# Patient Record
Sex: Female | Born: 1955
Health system: Southern US, Community
[De-identification: ages and names within clinical notes are randomized; demographics above are authoritative.]

## PROBLEM LIST (undated history)

## (undated) DIAGNOSIS — E119 Type 2 diabetes mellitus without complications: Secondary | ICD-10-CM

## (undated) DIAGNOSIS — I10 Essential (primary) hypertension: Secondary | ICD-10-CM

## (undated) DIAGNOSIS — G4762 Sleep related leg cramps: Secondary | ICD-10-CM

## (undated) DIAGNOSIS — G709 Myoneural disorder, unspecified: Secondary | ICD-10-CM

## (undated) DIAGNOSIS — K219 Gastro-esophageal reflux disease without esophagitis: Secondary | ICD-10-CM

## (undated) DIAGNOSIS — M1990 Unspecified osteoarthritis, unspecified site: Secondary | ICD-10-CM

## (undated) HISTORY — PX: FOOT SURGERY: SHX648

## (undated) HISTORY — PX: BACK SURGERY: SHX140

## (undated) HISTORY — PX: TONSILLECTOMY: SUR1361

## (undated) HISTORY — PX: ROTATOR CUFF REPAIR: SHX139

## (undated) HISTORY — PX: TUBAL LIGATION: SHX77

---

## 1978-03-10 HISTORY — PX: TUBAL LIGATION: SHX77

## 1998-09-06 ENCOUNTER — Other Ambulatory Visit: Admission: RE | Admit: 1998-09-06 | Discharge: 1998-09-06 | Payer: Self-pay | Admitting: Obstetrics and Gynecology

## 1999-05-31 ENCOUNTER — Encounter: Payer: Self-pay | Admitting: Family Medicine

## 1999-05-31 ENCOUNTER — Encounter: Admission: RE | Admit: 1999-05-31 | Discharge: 1999-05-31 | Payer: Self-pay | Admitting: Family Medicine

## 1999-08-12 ENCOUNTER — Other Ambulatory Visit: Admission: RE | Admit: 1999-08-12 | Discharge: 1999-08-12 | Payer: Self-pay | Admitting: Family Medicine

## 2000-06-04 ENCOUNTER — Encounter: Payer: Self-pay | Admitting: Family Medicine

## 2000-06-04 ENCOUNTER — Encounter: Admission: RE | Admit: 2000-06-04 | Discharge: 2000-06-04 | Payer: Self-pay | Admitting: Family Medicine

## 2000-07-08 ENCOUNTER — Other Ambulatory Visit: Admission: RE | Admit: 2000-07-08 | Discharge: 2000-07-08 | Payer: Self-pay | Admitting: Family Medicine

## 2001-10-26 ENCOUNTER — Encounter: Admission: RE | Admit: 2001-10-26 | Discharge: 2001-10-26 | Payer: Self-pay | Admitting: Family Medicine

## 2001-10-26 ENCOUNTER — Encounter: Payer: Self-pay | Admitting: Family Medicine

## 2003-04-06 ENCOUNTER — Encounter: Admission: RE | Admit: 2003-04-06 | Discharge: 2003-04-06 | Payer: Self-pay | Admitting: Family Medicine

## 2003-11-08 ENCOUNTER — Other Ambulatory Visit: Admission: RE | Admit: 2003-11-08 | Discharge: 2003-11-08 | Payer: Self-pay | Admitting: Family Medicine

## 2004-01-11 ENCOUNTER — Ambulatory Visit (HOSPITAL_COMMUNITY): Admission: RE | Admit: 2004-01-11 | Discharge: 2004-01-11 | Payer: Self-pay | Admitting: Gastroenterology

## 2004-02-14 ENCOUNTER — Other Ambulatory Visit: Admission: RE | Admit: 2004-02-14 | Discharge: 2004-02-14 | Payer: Self-pay | Admitting: Family Medicine

## 2004-11-05 ENCOUNTER — Encounter: Admission: RE | Admit: 2004-11-05 | Discharge: 2004-11-05 | Payer: Self-pay | Admitting: Family Medicine

## 2004-12-31 ENCOUNTER — Other Ambulatory Visit: Admission: RE | Admit: 2004-12-31 | Discharge: 2004-12-31 | Payer: Self-pay | Admitting: Family Medicine

## 2005-12-23 ENCOUNTER — Encounter: Admission: RE | Admit: 2005-12-23 | Discharge: 2005-12-23 | Payer: Self-pay | Admitting: Family Medicine

## 2006-01-05 ENCOUNTER — Other Ambulatory Visit: Admission: RE | Admit: 2006-01-05 | Discharge: 2006-01-05 | Payer: Self-pay | Admitting: Family Medicine

## 2006-03-26 ENCOUNTER — Ambulatory Visit (HOSPITAL_COMMUNITY): Admission: RE | Admit: 2006-03-26 | Discharge: 2006-03-27 | Payer: Self-pay | Admitting: Specialist

## 2007-01-11 ENCOUNTER — Encounter: Admission: RE | Admit: 2007-01-11 | Discharge: 2007-01-11 | Payer: Self-pay | Admitting: Family Medicine

## 2007-02-09 ENCOUNTER — Other Ambulatory Visit: Admission: RE | Admit: 2007-02-09 | Discharge: 2007-02-09 | Payer: Self-pay | Admitting: Family Medicine

## 2008-02-21 ENCOUNTER — Other Ambulatory Visit: Admission: RE | Admit: 2008-02-21 | Discharge: 2008-02-21 | Payer: Self-pay | Admitting: Family Medicine

## 2009-01-30 ENCOUNTER — Encounter: Admission: RE | Admit: 2009-01-30 | Discharge: 2009-01-30 | Payer: Self-pay | Admitting: Family Medicine

## 2009-02-08 ENCOUNTER — Encounter: Admission: RE | Admit: 2009-02-08 | Discharge: 2009-02-08 | Payer: Self-pay | Admitting: Family Medicine

## 2010-03-07 ENCOUNTER — Encounter
Admission: RE | Admit: 2010-03-07 | Discharge: 2010-03-07 | Payer: Self-pay | Source: Home / Self Care | Attending: Family Medicine | Admitting: Family Medicine

## 2010-03-15 ENCOUNTER — Encounter
Admission: RE | Admit: 2010-03-15 | Discharge: 2010-03-15 | Payer: Self-pay | Source: Home / Self Care | Attending: Family Medicine | Admitting: Family Medicine

## 2010-03-31 ENCOUNTER — Encounter: Payer: Self-pay | Admitting: Family Medicine

## 2010-07-26 NOTE — Op Note (Signed)
NAME:  [name], [name]             ACCOUNT NO.:  [id number]   MEDICAL RECORD NO.:  [id number]          PATIENT TYPE:  AMB   LOCATION:  ENDO                         FACILITY:  [facility]   PHYSICIAN:  Graylin Shiver, M.D.   DATE OF BIRTH:  07/06/1955   DATE OF PROCEDURE:  01/11/2004  DATE OF DISCHARGE:                                 OPERATIVE REPORT   PROCEDURE:  Colonoscopy.   INDICATION FOR PROCEDURE:  Family history of colon cancer.   Informed consent was obtained after explanation of the risks of bleeding,  infection, and perforation.   PREMEDICATIONS:  1.  Fentanyl 100 mcg IV.  2.  Versed 9 mg IV.   DESCRIPTION OF PROCEDURE:  With the patient in the left lateral decubitus  position, a rectal exam was performed; no masses were felt.  The Olympus  colonoscope was inserted into the rectum and advanced around a tortuous  colon to the cecum.  Cecal landmarks were identified.  The cecum and  ascending colon were normal.  The transverse colon normal.  The descending  colon normal.  The sigmoid showed a few diverticula, and the rectum was  normal.  She tolerated the procedure well without complications.   IMPRESSION:  Few scattered diverticula in the sigmoid colon.   RECOMMENDATIONS:  I would recommend a follow up colonoscopy again in 5 years  in view of the family history.      SFG/MEDQ  D:  01/11/2004  T:  01/11/2004  Job:  [id number]   cc:   Molly Maduro L. Foy [place], M.D.  [address] [place]  [place] [address]  Fax: [phone]

## 2010-07-26 NOTE — Op Note (Signed)
[name], [name]             ACCOUNT NO.:  [id number]   MEDICAL RECORD NO.:  [id number]          PATIENT TYPE:  AMB   LOCATION:  DAY                          [facility]ACILITY:  [facility]   PHYSICIAN:  Jene Every, M.D.    DATE OF BIRTH:  [birth year]   DATE OF PROCEDURE:  03/26/2006  DATE OF DISCHARGE:                               OPERATIVE REPORT   PREOPERATIVE DIAGNOSIS:  Herniated nucleus pulposus, spinal stenosis, L5-  S1, left.   POSTOPERATIVE DIAGNOSIS:  Herniated nucleus pulposus, spinal stenosis,  L5-S1, left.   PROCEDURE PERFORMED:  Lateral recess decompression, microdiskectomy,  foraminotomy of L5-S1, left.   ANESTHESIA:  General.   ASSISTANT:  Roma Schanz, P.A.   BRIEF HISTORY/INDICATIONS:  A 55 year old with S1 radiculopathy with  positive neural tension signs.  Plantar flexion weakness, diminished  Achilles reflexes.  MRI indicating focal disk herniation compressing the  S1 root and the lateral recess, associated stenosis.  Operative  intervention was indicated for decompression of the S1 nerve root.  Risks and benefits were discussed, including bleeding, infection, damage  to vascular structures, CSF leakage, epidural fibrosis, adjacent segment  disease, need for fusion in the future, anesthetic complications, etc.   TECHNIQUE:  With the patient in supine position after the induction of  adequate general anesthesia and 1 gm of Kefzol, she was placed on the  [place] frame.  All bony prominences were well padded.  The lumbar  region was prepped and draped in the usual sterile fashion.  Two 18  gauge spinal needles were utilized to localize the 51 interspace,  confirmed with x-ray.  An incision was made through the spinous process  of 5-S1.  Subcutaneous tissue was dissected.  Electrocautery was  utilized to achieve hemostasis.  The dorsal lumbar fascia identified  bilaterally in the skin incision.  The paraspinous muscle was elevated  from the lamina of 5 and 1.   McCullough retractor was placed.  The  operating was draped and brought into the surgical field after a  Penfield 4 was placed in the interlaminar space, confirming the disk  space at L5-S1.  Hemilaminotomy of the caudad edge of 5 was performed  with 3 mm Kerrison.  The cephalad edges of S1 was detached, utilizing a  straight curette.  Foraminotomy of S1 was performed.  Severe compression  of the S1 nerve root was noted.  After that, a foraminotomy was  performed to decompress the lateral recess, utilizing a 2 mm Kerrison.  This was done through the medial border of the pedicle.  I then gently  mobilized an S1 nerve root medially.  We found a focal HNP; therefore,  annulotomy was performed.  Copious portion of disk material was removed  with straight and up-biting pituitary and mobilized with an Epstein and  further fragments were removed.  Following the thorough diskectomy and  decompression, the median nerve root was erythematous and edematous but  had at least 1 cm of excursion medial to the pedicle without difficulty.  I passed a hockey stick probe in the foramen of 5 and S1, found to be  widely patent.  I  checked the axilla nerve root, the shoulder of the  root, and beneath the thecal sac.  There was no evidence of residual  disk herniation.  Next, the disk space was copiously irrigated with  antibiotic irrigation.  Inspection revealed no evidence of CSF leakage  or active bleeding.  Thrombin-soaked Gelfoam was placed in the  laminotomy defect.  The McCullough retractor was removed, and the  paraspinous muscle was inspected.  Once there was no active bleeding,  the dorsal lumbar fascia was reapproximated with 0 Vicryl interrupted  figure-of-eight sutures.  Subcutaneous tissue reapproximated with 2-0  Vicryl simple sutures.  The skin was reapproximated with 4-0  subcuticular Prolene.  The wound was reinforced with  Steri-Strips.  Sterile dressing applied.  Placed supine on the  hospital  bed.  Extubated without difficulty.  Transported to the recovery room in  satisfactory condition.   Patient tolerated the procedure well without complications.      Jene Every, M.D.  Electronically Signed     JB/MEDQ  D:  03/26/2006  T:  03/26/2006  Job:  [id number]

## 2011-02-11 ENCOUNTER — Other Ambulatory Visit: Payer: Self-pay | Admitting: Family Medicine

## 2011-02-11 DIAGNOSIS — Z1231 Encounter for screening mammogram for malignant neoplasm of breast: Secondary | ICD-10-CM

## 2011-03-17 ENCOUNTER — Ambulatory Visit
Admission: RE | Admit: 2011-03-17 | Discharge: 2011-03-17 | Disposition: A | Discharge status: Home / Self Care | Payer: PRIVATE HEALTH INSURANCE | Source: Ambulatory Visit | Attending: Family Medicine | Admitting: Family Medicine

## 2011-03-17 DIAGNOSIS — Z1231 Encounter for screening mammogram for malignant neoplasm of breast: Secondary | ICD-10-CM

## 2012-03-29 ENCOUNTER — Other Ambulatory Visit: Payer: Self-pay | Admitting: Family Medicine

## 2012-03-29 DIAGNOSIS — Z1231 Encounter for screening mammogram for malignant neoplasm of breast: Secondary | ICD-10-CM

## 2012-04-22 ENCOUNTER — Ambulatory Visit: Payer: PRIVATE HEALTH INSURANCE

## 2012-04-28 ENCOUNTER — Ambulatory Visit: Payer: PRIVATE HEALTH INSURANCE

## 2012-04-30 ENCOUNTER — Ambulatory Visit
Admission: RE | Admit: 2012-04-30 | Discharge: 2012-04-30 | Disposition: A | Discharge status: Home / Self Care | Payer: BC Managed Care – PPO | Source: Ambulatory Visit | Attending: Family Medicine | Admitting: Family Medicine

## 2012-04-30 DIAGNOSIS — Z1231 Encounter for screening mammogram for malignant neoplasm of breast: Secondary | ICD-10-CM

## 2013-04-05 ENCOUNTER — Other Ambulatory Visit: Payer: Self-pay

## 2013-04-05 DIAGNOSIS — Z1231 Encounter for screening mammogram for malignant neoplasm of breast: Secondary | ICD-10-CM

## 2013-05-06 ENCOUNTER — Ambulatory Visit
Admission: RE | Admit: 2013-05-06 | Discharge: 2013-05-06 | Disposition: A | Discharge status: Home / Self Care | Payer: BC Managed Care – PPO | Source: Ambulatory Visit

## 2013-05-06 DIAGNOSIS — Z1231 Encounter for screening mammogram for malignant neoplasm of breast: Secondary | ICD-10-CM

## 2014-02-15 ENCOUNTER — Ambulatory Visit: Payer: Self-pay | Admitting: Orthopedic Surgery

## 2014-02-17 ENCOUNTER — Ambulatory Visit: Payer: Self-pay | Admitting: Orthopedic Surgery

## 2014-02-17 NOTE — H&P (Signed)
[name] DOB: 12/16/1955 Divorced / Language: English / Race: White Female  H&P Date: 02/15/14  Chief complaint: back and R leg pain  History of Present Illness  The patient is a 58 year old female who presents for a Follow-up for Follow-up back. The patient is being followed for their right-sided back pain. They are now 1 day(s) out from ESI (L-4). Symptoms reported today include: pain (low back), weakness (right leg) and leg pain (right). The patient states that they are doing poorly. Current treatment includes: NSAIDs (Duexis and Neurontin). The following medication has been used for pain control: Norco. The patient reports their current pain level to be severe and 10 / 10. The patient presents today following ESI. Note for "Follow-up back": The increased strength of Norco did help her some with pain, and Neurontin as well, however her pain has progressively worsened since her last visit. She is worst in the morning when she first gets up, better by the time she is at work and walking. She is worse sitting and lying down and better standing. Pain continues to radiate down the R buttock, lateral thigh to anterior lower leg to the ankle, no foot pain. Increased pain in the anterior, medial thigh region as well since her last visit. She has also noted some stiffness in her right knee. She is having trouble sleeping due to pain. Patient went to work Monday and had to leave due to pain. She underwent her ESI yesterday and reports zero relief. She called and spoke with Dr. Beane this morning about other options and he asked her to come in to discuss possible surgery. She is tearful and reports severe pain.  Problem List/Past Medical Hx Spinal stenosis, lumbar (M48.06) Lumbar radiculitis (M54.16) Diabetes Mellitus, Type II Diverticulitis Of Colon High blood pressure Hypercholesterolemia Syndrome, rotator cuff NOS  Bunion  Enthesopathy, hip Plantar fasciitis   Allergies  No Known  Drug Allergies  Family History First Degree Relatives Cancer mother and sister Congestive Heart Failure mother Liver Disease, Chronic sister Hypertension brother Diabetes Mellitus grandmother fathers side brother and child  Social History Children 1 Alcohol use current drinker; drinks beer; only occasionally per week Exercise Exercises rarely; does other Exercises monthly; does running / walking Drug/Alcohol Rehab (Previously) no Tobacco use never smoker Current work status working full time Drug/Alcohol Rehab (Currently) no Pain Contract no Tobacco / smoke exposure yes Marital status divorced Number of flights of stairs before winded greater than 5 Illicit drug use no Living situation live alone  Medication History  Neurontin (300MG Capsule, 1 (one) Oral po q hs x 3 days, then 1 tab po BID x 3 days, then 1 tab TID, Taken starting 02/10/2014) Active. Norco (10-325MG Tablet, 1 (one) Tablet Oral every 6-8 hours as needed for pain, Taken starting 02/07/2014) Active. Benazepril-Hydrochlorothiazide (10-12.5MG Tablet, Oral) Active. Lovastatin (40MG Tablet, Oral) Active. MetFORMIN HCl ER (500MG Tablet ER 24HR, Oral) Active. ValACYclovir HCl (500MG Tablet, Oral as needed) Active. Tylenol (325MG Tablet, 2 Oral as directed) Active. Cyclobenzaprine HCl (10MG Tablet, Oral) Active. Meloxicam (15MG Tablet, Oral) Active. Vitamin C (1000MG Tablet, 1 Oral) Active. Medications Reconciled  Pregnancy / Birth History  Pregnant no  Past Surgical History Tonsillectomy Tubal Ligation Foot Surgery left Spinal Surgery  Review of Systems General Not Present- Fever and Weight Loss. Skin Not Present- Rash. Cardiovascular Not Present- Chest Pain and Shortness of Breath. Musculoskeletal Present- Back Pain, Muscle Pain and Muscle Weakness. Not Present- Joint Swelling. Neurological Not Present- Difficulty with balance,   Loss of bladder control, Loss of bowel  control and Weakness. Endocrine Not Present- Excessive Thirst and Excessive Urination. Hematology Not Present- Easy Bruising.  Physical Exam  General Mental Status -Alert. General Appearance-pleasant, In acute distress, Tearful, appears uncomfortable(alternates between standing and sitting, leaning left). Orientation-Oriented X3. Build & Nutrition-Well nourished and Well developed. Gait-Antalgic.  Abdomen Palpation/Percussion Tenderness - Abdomen is non-tender to palpation. Rigidity (guarding) - Abdomen is soft.  Peripheral Vascular Lower Extremity Palpation - Homan's sign - Bilateral - Negative (normal). Posterior tibial pulse - Bilateral - 2+. Dorsalis pedis pulse - Bilateral - 2+.  Neurologic Motor Strength - Hip Flexion - Bilateral - 5/5. Quadriceps - Left - 5/5. Right - 5/5(trace weakness, 5-/5). Hamstrings - Left - 5/5. Right - 5/5(trace weakness, 5-/5). Ankle Dorsiflexion - Bilateral - 5/5. Ankle Plantarflexion - Bilateral - 5/5. Extensor Hallucis Longus - Left - 5/5. Right - 4/5(4+/5). Sensation Lower Extremity - Bilateral - sensation is intact to light touch in the lower extremity. Reflexes Patellar Reflex - Bilateral - 2+. Achilles Reflex - Bilateral - 2+. Babinski - Bilateral - Babinski not present. Clonus - Bilateral - clonus not present.  Musculoskeletal Spine/Ribs/Pelvis Lumbosacral Spine: Inspection and Palpation - Tenderness - right buttock is tender to palpation and right greater trochanter is tender to palpation, no tenderness to palpation of the lumbar spinous processes, no tenderness to palpation of the left flank, no tenderness to palpation of right flank, no tenderness to palpation of left lumbar paraspinals, no tenderness to palpation of right lumbar paraspinals, no tenderness to palpation of the left greater trochanter. Swelling - none. Surrounding tissue tension/texture is - soft. Sensation - normal. Other characteristics - no ecchymosis, no  abnormal warmth, no erythema, no evidence of cellulitis. ROM - Flexion - moderately decreased range of motion and painful. Extension - moderately decreased range of motion and painful. Testing limited - due to pain. Special Testing - Lumbar - Right seated straight leg raise positive(produces pain R buttock, thigh, lower leg), Left seated straight leg raise negative. Lower Extremity  Right Lower Extremity: Right Hip - ROM - Full ROM of the hip and pain-free. Right Knee - ROM - Full ROM of the knee and pain-free. Right Ankle - ROM - Full and pain-free ROM of the ankle. Left Lower Extremity: Left Hip - ROM - Full ROM of the hip and pain-free. Left Knee - ROM - Full ROM of the knee and pain-free. Left Ankle - ROM - Full and pain-free ROM of the ankle.  Lymphatic General Lymphatics Description - No Localized lymphadenopathy.  Imaging MRI images and report reviewed today by Dr. Beane with multilevel DDD. Post-op changes L5-S1 left with small left sided disc extrusion, asymptomatic. Right sided lateral recess stenosis L3-4 and L4-5 with probable encroachment on the L4 and L5 nerve roots, respectively. HNP L3-4 with right posterolateral extruded fragment. This is likely underestimated due to pt position.  Assessment & Plan Low back pain radiating to right lower extremity (M54.5) Lumbar radiculitis (M54.16) Lumbar HNP (M51.26) Spinal stenosis, lumbar (M48.06)  Pt nearly 8 years s/p decompression L5-S1 left with new onset RLE symptoms x approx 6.5 weeks without injury or change in activity, with myotomal weakness and dermatomal dysthesias, neural tension signs, refractory to NSAIDs, pain medications, neurontin, relative rest, activity modifications, HEP, progressively worsening pain. We again discussed relevant anatomy, MRI, lateral recess stenosis at L3-4 and L4-5 on the right, extruded disc fragment L3-4, in combination they are causing her RLE symptoms, multiple dermatomes as there are multiple nerve    roots affected. She does have EHL weakness consistent with L5 distribution. Discussed tx options, thus far refractory to conservative tx including recent ESI at L4-5. She likely would not benefit from further ESI's. Given her ongoing weakness, neural tension signs, and severe pain, recommend proceeding with microlumbar decompression L3-4, L4-5 right. Discussed the procedure itself as well as risks, complications, and alternatives, including but not limited to DVT, PE, infx, bleeding, failure of procedure, need for secondary procedure, nerve injury, ongoing pain/symptoms, dural tear, CSF leak, and anesthesia risk, even stroke or death. Also discussed typical post-op course as well as spine precautions: no lifting, bending, twisting, prolonged sitting x 6 weeks post-op. Discussed walking program, PT, gentle stretching post-op. All questions were answered. Patient desires to proceed with surgery. She understands the intention of this procedure is to make an unbearable situation bearable. We discussed her underlying DDD and possible ongoing lower back pain, the importance of activity modifications long term to avoid recurrent injury. Her sugars have been better controlled. We will obtain a pre-op A1C and other appropriate labwork. We will proceed accordingly. She will continue working 5-10 hr days depending on her pain level, light duty, in the interim. If unable to tolerate work she will let us know. She will follow up 10-14 days post-op for suture removal and will call with any questions or concerns. Refills by request.  I had an extensive discussion of the risks and benefits of the lumbar decompression with the patient including bleeding, infection, damage to neurovascular structures, epidural fibrosis, CSF leak requiring repair. We also discussed increase in pain, adjacent segment disease, recurrent disc herniation, need for future surgery including repeat decompression and/or fusion. We also discussed risks of  postoperative hematoma, paralysis, anesthetic complications including DVT, PE, death, cardiopulmonary dysfunction. In addition, the perioperative and postoperative courses were discussed in detail including the rehabilitative time and return to functional activity and work. I provided the patient with an illustrated handout and utilized the appropriate surgical models.  Signed electronically by Laurie Lovejoy PA-C for Dr. Beane  

## 2014-02-23 ENCOUNTER — Encounter (HOSPITAL_COMMUNITY)
Admission: RE | Admit: 2014-02-23 | Discharge: 2014-02-23 | Disposition: A | Discharge status: Home / Self Care | Payer: BC Managed Care – PPO | Source: Ambulatory Visit | Attending: Specialist | Admitting: Specialist

## 2014-02-23 ENCOUNTER — Ambulatory Visit (HOSPITAL_COMMUNITY)
Admission: RE | Admit: 2014-02-23 | Discharge: 2014-02-23 | Disposition: A | Discharge status: Home / Self Care | Payer: BC Managed Care – PPO | Source: Ambulatory Visit | Attending: Orthopedic Surgery | Admitting: Orthopedic Surgery

## 2014-02-23 ENCOUNTER — Other Ambulatory Visit: Payer: Self-pay

## 2014-02-23 ENCOUNTER — Encounter (HOSPITAL_COMMUNITY): Payer: Self-pay

## 2014-02-23 DIAGNOSIS — I10 Essential (primary) hypertension: Secondary | ICD-10-CM

## 2014-02-23 DIAGNOSIS — M5126 Other intervertebral disc displacement, lumbar region: Secondary | ICD-10-CM

## 2014-02-23 DIAGNOSIS — Z01818 Encounter for other preprocedural examination: Secondary | ICD-10-CM | POA: Insufficient documentation

## 2014-02-23 DIAGNOSIS — M479 Spondylosis, unspecified: Secondary | ICD-10-CM | POA: Insufficient documentation

## 2014-02-23 DIAGNOSIS — Z01812 Encounter for preprocedural laboratory examination: Secondary | ICD-10-CM | POA: Insufficient documentation

## 2014-02-23 DIAGNOSIS — Z01810 Encounter for preprocedural cardiovascular examination: Secondary | ICD-10-CM | POA: Diagnosis not present

## 2014-02-23 HISTORY — DX: Gastro-esophageal reflux disease without esophagitis: K21.9

## 2014-02-23 HISTORY — DX: Myoneural disorder, unspecified: G70.9

## 2014-02-23 HISTORY — DX: Unspecified osteoarthritis, unspecified site: M19.90

## 2014-02-23 HISTORY — DX: Essential (primary) hypertension: I10

## 2014-02-23 HISTORY — DX: Type 2 diabetes mellitus without complications: E11.9

## 2014-02-23 LAB — SURGICAL PCR SCREEN
MRSA, PCR: NEGATIVE
STAPHYLOCOCCUS AUREUS: NEGATIVE

## 2014-02-23 LAB — CBC
HCT: 40.5 % (ref 36.0–46.0)
Hemoglobin: 12.9 g/dL (ref 12.0–15.0)
MCH: 29.9 pg (ref 26.0–34.0)
MCHC: 31.9 g/dL (ref 30.0–36.0)
MCV: 94 fL (ref 78.0–100.0)
PLATELETS: 353 10*3/uL (ref 150–400)
RBC: 4.31 MIL/uL (ref 3.87–5.11)
RDW: 12.6 % (ref 11.5–15.5)
WBC: 9 10*3/uL (ref 4.0–10.5)

## 2014-02-23 LAB — BASIC METABOLIC PANEL
ANION GAP: 12 (ref 5–15)
BUN: 19 mg/dL (ref 6–23)
CO2: 26 mEq/L (ref 19–32)
Calcium: 10.3 mg/dL (ref 8.4–10.5)
Chloride: 101 mEq/L (ref 96–112)
Creatinine, Ser: 0.8 mg/dL (ref 0.50–1.10)
GFR, EST NON AFRICAN AMERICAN: 79 mL/min — AB (ref >90)
Glucose, Bld: 120 mg/dL — ABNORMAL HIGH (ref 70–99)
Potassium: 4.8 mEq/L (ref 3.7–5.3)
SODIUM: 139 meq/L (ref 137–147)

## 2014-02-23 LAB — HEMOGLOBIN A1C
Hgb A1c MFr Bld: 7 % — ABNORMAL HIGH (ref <5.7)
MEAN PLASMA GLUCOSE: 154 mg/dL — AB (ref <117)

## 2014-02-23 NOTE — Pre-Procedure Instructions (Signed)
02-23-14 EKG, CXR, Lumbar spine xray done today.

## 2014-02-23 NOTE — Patient Instructions (Addendum)
[place]  02/23/2014   Your procedure is scheduled on:   03-01-2014 Wednesday  Enter through [facility] Entrance and follow signs to [facility]. Arrive at     0800   AM .  Call this number if you have problems the morning of surgery: [phone]  Or Presurgical Testing [phone].   For Living Will and/or Health Care Power Attorney Forms: please provide copy for your medical record,may bring AM of surgery(Forms should be already notarized -we do not provide this service).(02-23-14  No information preferred today).      Do not eat food/ or drink: After Midnight.     Take these medicines the morning of surgery with A SIP OF WATER: Ranitidine. Gabapentin. Hydrocodone. Lovastatin.   Do not wear jewelry, make-up or nail polish.  Do not wear deodorant, lotions, powders, or perfumes.   Do not shave legs and under arms- 48 hours(2 days) prior to first CHG shower.(Shaving face and neck okay.)  Do not bring valuables to the hospital.(Hospital is not responsible for lost valuables).  Contacts, dentures or removable bridgework, body piercing, hair pins may not be worn into surgery.  Leave suitcase in the car. After surgery it may be brought to your room.  For patients admitted to the hospital, checkout time is 11:00 AM the day of discharge.(Restricted visitors-Any Persons displaying flu-like symptoms or illness).    Patients discharged the day of surgery will not be allowed to drive home. Must have responsible person with you x 24 hours once discharged.  Name and phone number of your driver: [name], [phone] cell     Please read over the following fact sheets that you were given:  CHG(Chlorhexidine Gluconate 4% Surgical Soap) use, MRSA instructions. Incentive Spirometry Instruction.           [place] - Preparing for Surgery Before surgery, you can play an important role.  Because skin is not sterile, your skin needs to be as free of  germs as possible.  You can reduce the number of germs on your skin by washing with CHG (chlorahexidine gluconate) soap before surgery.  CHG is an antiseptic cleaner which kills germs and bonds with the skin to continue killing germs even after washing. Please DO NOT use if you have an allergy to CHG or antibacterial soaps.  If your skin becomes reddened/irritated stop using the CHG and inform your nurse when you arrive at Short Stay. Do not shave (including legs and underarms) for at least 48 hours prior to the first CHG shower.  You may shave your face/neck. Please follow these instructions carefully:  1.  Shower with CHG Soap the night before surgery and the  morning of Surgery.  2.  If you choose to wash your hair, wash your hair first as usual with your  normal  shampoo.  3.  After you shampoo, rinse your hair and body thoroughly to remove the  shampoo.                           4.  Use CHG as you would any other liquid soap.  You can apply chg directly  to the skin and wash                       Gently with a scrungie or clean washcloth.  5.  Apply the CHG Soap to your body ONLY FROM THE  NECK DOWN.   Do not use on face/ open                           Wound or open sores. Avoid contact with eyes, ears mouth and genitals (private parts).                       Wash face,  Genitals (private parts) with your normal soap.             6.  Wash thoroughly, paying special attention to the area where your surgery  will be performed.  7.  Thoroughly rinse your body with warm water from the neck down.  8.  DO NOT shower/wash with your normal soap after using and rinsing off  the CHG Soap.                9.  Pat yourself dry with a clean towel.            10.  Wear clean pajamas.            11.  Place clean sheets on your bed the night of your first shower and do not  sleep with pets. Day of Surgery : Do not apply any lotions/deodorants the morning of surgery.  Please wear clean clothes to the  hospital/surgery center.  FAILURE TO FOLLOW THESE INSTRUCTIONS MAY RESULT IN THE CANCELLATION OF YOUR SURGERY PATIENT SIGNATURE_________________________________  NURSE SIGNATURE__________________________________  ________________________________________________________________________   [name]  An incentive spirometer is a tool that can help keep your lungs clear and active. This tool measures how well you are filling your lungs with each breath. Taking long deep breaths may help reverse or decrease the chance of developing breathing (pulmonary) problems (especially infection) following:  A long period of time when you are unable to move or be active. BEFORE THE PROCEDURE   If the spirometer includes an indicator to show your best effort, your nurse or respiratory therapist will set it to a desired goal.  If possible, sit up straight or lean slightly forward. Try not to slouch.  Hold the incentive spirometer in an upright position. INSTRUCTIONS FOR USE  1. Sit on the edge of your bed if possible, or sit up as far as you can in bed or on a chair. 2. Hold the incentive spirometer in an upright position. 3. Breathe out normally. 4. Place the mouthpiece in your mouth and seal your lips tightly around it. 5. Breathe in slowly and as deeply as possible, raising the piston or the ball toward the top of the column. 6. Hold your breath for 3-5 seconds or for as long as possible. Allow the piston or ball to fall to the bottom of the column. 7. Remove the mouthpiece from your mouth and breathe out normally. 8. Rest for a few seconds and repeat Steps 1 through 7 at least 10 times every 1-2 hours when you are awake. Take your time and take a few normal breaths between deep breaths. 9. The spirometer may include an indicator to show your best effort. Use the indicator as a goal to work toward during each repetition. 10. After each set of 10 deep breaths, practice coughing to be sure  your lungs are clear. If you have an incision (the cut made at the time of surgery), support your incision when coughing by placing a pillow or rolled up towels firmly against it. Once you are  able to get out of bed, walk around indoors and cough well. You may stop using the incentive spirometer when instructed by your caregiver.  RISKS AND COMPLICATIONS  Take your time so you do not get dizzy or light-headed.  If you are in pain, you may need to take or ask for pain medication before doing incentive spirometry. It is harder to take a deep breath if you are having pain. AFTER USE  Rest and breathe slowly and easily.  It can be helpful to keep track of a log of your progress. Your caregiver can provide you with a simple table to help with this. If you are using the spirometer at home, follow these instructions: [place] IF:   You are having difficultly using the spirometer.  You have trouble using the spirometer as often as instructed.  Your pain medication is not giving enough relief while using the spirometer.  You develop fever of 100.5 F (38.1 C) or higher. SEEK IMMEDIATE MEDICAL CARE IF:   You cough up bloody sputum that had not been present before.  You develop fever of 102 F (38.9 C) or greater.  You develop worsening pain at or near the incision site. MAKE SURE YOU:   Understand these instructions.  Will watch your condition.  Will get help right away if you are not doing well or get worse. Document Released: 07/07/2006 Document Revised: 05/19/2011 Document Reviewed: 09/07/2006 [facility] Patient Information 2014 [place], [place].   ________________________________________________________________________

## 2014-02-24 ENCOUNTER — Ambulatory Visit: Payer: Self-pay | Admitting: Orthopedic Surgery

## 2014-02-24 NOTE — Progress Notes (Signed)
Final EKG doen 02/23/14 in EPIC.  HgA1C done 02/23/14 faxed via EPIC to Dr Alinda Money.

## 2014-03-01 ENCOUNTER — Ambulatory Visit (HOSPITAL_COMMUNITY)
Admission: RE | Admit: 2014-03-01 | Discharge: 2014-03-02 | Disposition: A | Discharge status: Home / Self Care | Payer: BC Managed Care – PPO | Source: Ambulatory Visit | Attending: Specialist | Admitting: Specialist

## 2014-03-01 ENCOUNTER — Ambulatory Visit (HOSPITAL_COMMUNITY): Payer: BC Managed Care – PPO

## 2014-03-01 ENCOUNTER — Encounter (HOSPITAL_COMMUNITY)
Admission: RE | Disposition: A | Discharge status: Home / Self Care | Payer: Self-pay | Source: Ambulatory Visit | Attending: Specialist

## 2014-03-01 ENCOUNTER — Ambulatory Visit (HOSPITAL_COMMUNITY): Payer: BC Managed Care – PPO | Admitting: *Deleted

## 2014-03-01 ENCOUNTER — Encounter (HOSPITAL_COMMUNITY): Payer: Self-pay | Admitting: *Deleted

## 2014-03-01 DIAGNOSIS — I10 Essential (primary) hypertension: Secondary | ICD-10-CM | POA: Diagnosis not present

## 2014-03-01 DIAGNOSIS — M5126 Other intervertebral disc displacement, lumbar region: Secondary | ICD-10-CM | POA: Diagnosis present

## 2014-03-01 DIAGNOSIS — F1099 Alcohol use, unspecified with unspecified alcohol-induced disorder: Secondary | ICD-10-CM | POA: Diagnosis not present

## 2014-03-01 DIAGNOSIS — M545 Low back pain, unspecified: Secondary | ICD-10-CM

## 2014-03-01 DIAGNOSIS — M4806 Spinal stenosis, lumbar region: Secondary | ICD-10-CM | POA: Insufficient documentation

## 2014-03-01 DIAGNOSIS — Y909 Presence of alcohol in blood, level not specified: Secondary | ICD-10-CM | POA: Insufficient documentation

## 2014-03-01 DIAGNOSIS — K219 Gastro-esophageal reflux disease without esophagitis: Secondary | ICD-10-CM | POA: Diagnosis not present

## 2014-03-01 DIAGNOSIS — M48061 Spinal stenosis, lumbar region without neurogenic claudication: Secondary | ICD-10-CM | POA: Diagnosis present

## 2014-03-01 DIAGNOSIS — E119 Type 2 diabetes mellitus without complications: Secondary | ICD-10-CM | POA: Insufficient documentation

## 2014-03-01 DIAGNOSIS — E780 Pure hypercholesterolemia: Secondary | ICD-10-CM | POA: Insufficient documentation

## 2014-03-01 DIAGNOSIS — R52 Pain, unspecified: Secondary | ICD-10-CM

## 2014-03-01 HISTORY — PX: LUMBAR LAMINECTOMY/DECOMPRESSION MICRODISCECTOMY: SHX5026

## 2014-03-01 LAB — GLUCOSE, CAPILLARY
GLUCOSE-CAPILLARY: 146 mg/dL — AB (ref 70–99)
Glucose-Capillary: 130 mg/dL — ABNORMAL HIGH (ref 70–99)
Glucose-Capillary: 170 mg/dL — ABNORMAL HIGH (ref 70–99)
Glucose-Capillary: 98 mg/dL (ref 70–99)

## 2014-03-01 SURGERY — LUMBAR LAMINECTOMY/DECOMPRESSION MICRODISCECTOMY 2 LEVELS
Anesthesia: General | Laterality: Right

## 2014-03-01 MED ORDER — GABAPENTIN 300 MG PO CAPS
300.0000 mg | ORAL_CAPSULE | Freq: Three times a day (TID) | ORAL | Status: DC
  Administered 2014-03-01 – 2014-03-02 (×2): 300 mg via ORAL
  Filled 2014-03-01 (×4): qty 1

## 2014-03-01 MED ORDER — MIDAZOLAM HCL 2 MG/2ML IJ SOLN
INTRAMUSCULAR | Status: AC
  Filled 2014-03-01: qty 2

## 2014-03-01 MED ORDER — VITAMIN C 500 MG PO TABS
500.0000 mg | ORAL_TABLET | Freq: Every day | ORAL | Status: DC
  Administered 2014-03-01 – 2014-03-02 (×2): 500 mg via ORAL
  Filled 2014-03-01 (×2): qty 1

## 2014-03-01 MED ORDER — PHENYLEPHRINE HCL 10 MG/ML IJ SOLN
INTRAMUSCULAR | Status: DC | PRN
  Administered 2014-03-01: 80 ug via INTRAVENOUS
  Administered 2014-03-01: 160 ug via INTRAVENOUS
  Administered 2014-03-01 (×2): 80 ug via INTRAVENOUS
  Administered 2014-03-01: 120 ug via INTRAVENOUS
  Administered 2014-03-01: 80 ug via INTRAVENOUS

## 2014-03-01 MED ORDER — MENTHOL 3 MG MT LOZG
1.0000 | LOZENGE | OROMUCOSAL | Status: DC | PRN
  Filled 2014-03-01: qty 9

## 2014-03-01 MED ORDER — ONDANSETRON HCL 4 MG/2ML IJ SOLN: 4.0000 mg | INTRAMUSCULAR | Status: DC | PRN

## 2014-03-01 MED ORDER — LACTATED RINGERS IV SOLN
INTRAVENOUS | Status: DC
  Administered 2014-03-01: 11:00:00 via INTRAVENOUS
  Administered 2014-03-01: 1000 mL via INTRAVENOUS

## 2014-03-01 MED ORDER — ALUM & MAG HYDROXIDE-SIMETH 200-200-20 MG/5ML PO SUSP
30.0000 mL | Freq: Four times a day (QID) | ORAL | Status: DC | PRN
  Administered 2014-03-01: 30 mL via ORAL
  Filled 2014-03-01: qty 30

## 2014-03-01 MED ORDER — VITAMIN C 500 MG PO TABS: 500.0000 mg | ORAL_TABLET | Freq: Every day | ORAL | Status: DC

## 2014-03-01 MED ORDER — ROCURONIUM BROMIDE 100 MG/10ML IV SOLN
INTRAVENOUS | Status: DC | PRN
  Administered 2014-03-01: 40 mg via INTRAVENOUS

## 2014-03-01 MED ORDER — RISAQUAD PO CAPS
1.0000 | ORAL_CAPSULE | Freq: Every day | ORAL | Status: DC
  Administered 2014-03-01 – 2014-03-02 (×2): 1 via ORAL
  Filled 2014-03-01 (×2): qty 1

## 2014-03-01 MED ORDER — PHENOL 1.4 % MT LIQD
1.0000 | OROMUCOSAL | Status: DC | PRN
  Filled 2014-03-01: qty 177

## 2014-03-01 MED ORDER — POTASSIUM GLUCONATE 595 (99 K) MG PO TABS
595.0000 mg | ORAL_TABLET | Freq: Every day | ORAL | Status: DC
  Administered 2014-03-01 – 2014-03-02 (×2): 595 mg via ORAL
  Filled 2014-03-01 (×2): qty 1

## 2014-03-01 MED ORDER — BENAZEPRIL-HYDROCHLOROTHIAZIDE 10-12.5 MG PO TABS: 1.0000 | ORAL_TABLET | Freq: Every morning | ORAL | Status: DC

## 2014-03-01 MED ORDER — METHOCARBAMOL 500 MG PO TABS
500.0000 mg | ORAL_TABLET | Freq: Four times a day (QID) | ORAL | Status: DC | PRN
  Administered 2014-03-02: 500 mg via ORAL
  Filled 2014-03-01 (×2): qty 1

## 2014-03-01 MED ORDER — FENTANYL CITRATE 0.05 MG/ML IJ SOLN
INTRAMUSCULAR | Status: AC
  Filled 2014-03-01: qty 5

## 2014-03-01 MED ORDER — DOCUSATE SODIUM 100 MG PO CAPS: 100.0000 mg | ORAL_CAPSULE | Freq: Two times a day (BID) | ORAL | Status: DC | PRN

## 2014-03-01 MED ORDER — BUPIVACAINE-EPINEPHRINE (PF) 0.5% -1:200000 IJ SOLN
INTRAMUSCULAR | Status: DC | PRN
  Administered 2014-03-01: 14 mL via PERINEURAL

## 2014-03-01 MED ORDER — METOCLOPRAMIDE HCL 5 MG/ML IJ SOLN
INTRAMUSCULAR | Status: DC | PRN
  Administered 2014-03-01: 10 mg via INTRAVENOUS

## 2014-03-01 MED ORDER — POTASSIUM GLUCONATE 550 MG PO TABS: 550.0000 mg | ORAL_TABLET | Freq: Every day | ORAL | Status: DC

## 2014-03-01 MED ORDER — HYDROCODONE-ACETAMINOPHEN 5-325 MG PO TABS
1.0000 | ORAL_TABLET | ORAL | Status: DC | PRN
  Administered 2014-03-01 – 2014-03-02 (×4): 2 via ORAL
  Filled 2014-03-01 (×4): qty 2

## 2014-03-01 MED ORDER — SODIUM CHLORIDE 0.9 % IR SOLN
Status: AC
  Filled 2014-03-01: qty 1

## 2014-03-01 MED ORDER — HYDROMORPHONE HCL 1 MG/ML IJ SOLN
INTRAMUSCULAR | Status: AC
  Filled 2014-03-01: qty 1

## 2014-03-01 MED ORDER — ACETAMINOPHEN 650 MG RE SUPP: 650.0000 mg | RECTAL | Status: DC | PRN

## 2014-03-01 MED ORDER — CEFAZOLIN SODIUM-DEXTROSE 2-3 GM-% IV SOLR
INTRAVENOUS | Status: AC
  Filled 2014-03-01: qty 50

## 2014-03-01 MED ORDER — ONDANSETRON HCL 4 MG/2ML IJ SOLN
INTRAMUSCULAR | Status: DC | PRN
  Administered 2014-03-01: 4 mg via INTRAVENOUS

## 2014-03-01 MED ORDER — GLYCOPYRROLATE 0.2 MG/ML IJ SOLN
INTRAMUSCULAR | Status: AC
  Filled 2014-03-01: qty 2

## 2014-03-01 MED ORDER — THROMBIN 5000 UNITS EX SOLR
CUTANEOUS | Status: DC | PRN
  Administered 2014-03-01: 5000 [IU] via TOPICAL

## 2014-03-01 MED ORDER — HYDROMORPHONE HCL 1 MG/ML IJ SOLN
0.2500 mg | INTRAMUSCULAR | Status: DC | PRN
  Administered 2014-03-01 (×3): 0.5 mg via INTRAVENOUS

## 2014-03-01 MED ORDER — CEFAZOLIN SODIUM-DEXTROSE 2-3 GM-% IV SOLR
2.0000 g | INTRAVENOUS | Status: AC
  Administered 2014-03-01: 2 g via INTRAVENOUS

## 2014-03-01 MED ORDER — GLYCOPYRROLATE 0.2 MG/ML IJ SOLN
INTRAMUSCULAR | Status: DC | PRN
  Administered 2014-03-01: 0.6 mg via INTRAVENOUS

## 2014-03-01 MED ORDER — SODIUM CHLORIDE 0.9 % IJ SOLN
3.0000 mL | INTRAMUSCULAR | Status: DC | PRN
Start: 2014-03-01 — End: 2014-03-02

## 2014-03-01 MED ORDER — PHENYLEPHRINE HCL 10 MG/ML IJ SOLN
INTRAMUSCULAR | Status: AC
  Filled 2014-03-01: qty 1

## 2014-03-01 MED ORDER — PHENYLEPHRINE HCL 10 MG/ML IJ SOLN
10.0000 mg | INTRAVENOUS | Status: DC | PRN
  Administered 2014-03-01: 50 ug/min via INTRAVENOUS

## 2014-03-01 MED ORDER — HYDROMORPHONE HCL 1 MG/ML IJ SOLN: 0.5000 mg | INTRAMUSCULAR | Status: DC | PRN

## 2014-03-01 MED ORDER — FENTANYL CITRATE 0.05 MG/ML IJ SOLN
INTRAMUSCULAR | Status: DC | PRN
  Administered 2014-03-01: 100 ug via INTRAVENOUS
  Administered 2014-03-01 (×2): 50 ug via INTRAVENOUS

## 2014-03-01 MED ORDER — OXYCODONE-ACETAMINOPHEN 5-325 MG PO TABS: 1.0000 | ORAL_TABLET | ORAL | Status: DC | PRN

## 2014-03-01 MED ORDER — ONDANSETRON HCL 4 MG/2ML IJ SOLN
INTRAMUSCULAR | Status: AC
  Filled 2014-03-01: qty 2

## 2014-03-01 MED ORDER — METHOCARBAMOL 1000 MG/10ML IJ SOLN
500.0000 mg | Freq: Four times a day (QID) | INTRAVENOUS | Status: DC | PRN
  Administered 2014-03-01 (×2): 500 mg via INTRAVENOUS
  Filled 2014-03-01 (×3): qty 5

## 2014-03-01 MED ORDER — SODIUM CHLORIDE 0.9 % IV SOLN: 250.0000 mL | INTRAVENOUS | Status: DC

## 2014-03-01 MED ORDER — POLYMYXIN B SULFATE 500000 UNITS IJ SOLR
INTRAMUSCULAR | Status: DC | PRN
  Administered 2014-03-01: 500 mL

## 2014-03-01 MED ORDER — LACTATED RINGERS IV SOLN: INTRAVENOUS | Status: DC

## 2014-03-01 MED ORDER — LACTATED RINGERS IV SOLN
INTRAVENOUS | Status: DC
Start: 2014-03-01 — End: 2014-03-01

## 2014-03-01 MED ORDER — ACETAMINOPHEN 325 MG PO TABS
650.0000 mg | ORAL_TABLET | ORAL | Status: DC | PRN
  Administered 2014-03-02: 650 mg via ORAL
  Filled 2014-03-01: qty 2

## 2014-03-01 MED ORDER — THROMBIN 5000 UNITS EX SOLR
CUTANEOUS | Status: AC
  Filled 2014-03-01: qty 10000

## 2014-03-01 MED ORDER — PROPOFOL 10 MG/ML IV BOLUS
INTRAVENOUS | Status: AC
  Filled 2014-03-01: qty 20

## 2014-03-01 MED ORDER — ROCURONIUM BROMIDE 100 MG/10ML IV SOLN
INTRAVENOUS | Status: AC
  Filled 2014-03-01: qty 1

## 2014-03-01 MED ORDER — BUPIVACAINE-EPINEPHRINE (PF) 0.5% -1:200000 IJ SOLN
INTRAMUSCULAR | Status: AC
  Filled 2014-03-01: qty 30

## 2014-03-01 MED ORDER — CEFAZOLIN SODIUM-DEXTROSE 2-3 GM-% IV SOLR
2.0000 g | Freq: Three times a day (TID) | INTRAVENOUS | Status: AC
  Administered 2014-03-01 – 2014-03-02 (×3): 2 g via INTRAVENOUS
  Filled 2014-03-01 (×3): qty 50

## 2014-03-01 MED ORDER — LIDOCAINE HCL (CARDIAC) 20 MG/ML IV SOLN
INTRAVENOUS | Status: AC
  Filled 2014-03-01: qty 5

## 2014-03-01 MED ORDER — KCL IN DEXTROSE-NACL 20-5-0.45 MEQ/L-%-% IV SOLN
INTRAVENOUS | Status: DC
  Administered 2014-03-01: 14:00:00 via INTRAVENOUS
  Filled 2014-03-01 (×2): qty 1000

## 2014-03-01 MED ORDER — METOCLOPRAMIDE HCL 5 MG/ML IJ SOLN
INTRAMUSCULAR | Status: AC
  Filled 2014-03-01: qty 2

## 2014-03-01 MED ORDER — PHENYLEPHRINE 40 MCG/ML (10ML) SYRINGE FOR IV PUSH (FOR BLOOD PRESSURE SUPPORT)
PREFILLED_SYRINGE | INTRAVENOUS | Status: AC
  Filled 2014-03-01: qty 20

## 2014-03-01 MED ORDER — SODIUM CHLORIDE 0.9 % IJ SOLN
3.0000 mL | Freq: Two times a day (BID) | INTRAMUSCULAR | Status: DC
  Administered 2014-03-01: 3 mL via INTRAVENOUS

## 2014-03-01 MED ORDER — MIDAZOLAM HCL 5 MG/5ML IJ SOLN
INTRAMUSCULAR | Status: DC | PRN
  Administered 2014-03-01: 2 mg via INTRAVENOUS

## 2014-03-01 MED ORDER — METHOCARBAMOL 500 MG PO TABS: 500.0000 mg | ORAL_TABLET | Freq: Three times a day (TID) | ORAL | Status: DC

## 2014-03-01 MED ORDER — BENAZEPRIL HCL 10 MG PO TABS
10.0000 mg | ORAL_TABLET | Freq: Every day | ORAL | Status: DC
  Administered 2014-03-02: 10 mg via ORAL
  Filled 2014-03-01 (×2): qty 1

## 2014-03-01 MED ORDER — FAMOTIDINE 20 MG PO TABS
20.0000 mg | ORAL_TABLET | Freq: Two times a day (BID) | ORAL | Status: DC
  Administered 2014-03-01 – 2014-03-02 (×2): 20 mg via ORAL
  Filled 2014-03-01 (×3): qty 1

## 2014-03-01 MED ORDER — PROPOFOL 10 MG/ML IV BOLUS
INTRAVENOUS | Status: DC | PRN
  Administered 2014-03-01: 140 mg via INTRAVENOUS

## 2014-03-01 MED ORDER — HYDROCHLOROTHIAZIDE 12.5 MG PO CAPS
12.5000 mg | ORAL_CAPSULE | Freq: Every day | ORAL | Status: DC
  Administered 2014-03-02: 12.5 mg via ORAL
  Filled 2014-03-01 (×2): qty 1

## 2014-03-01 MED ORDER — NEOSTIGMINE METHYLSULFATE 10 MG/10ML IV SOLN
INTRAVENOUS | Status: AC
  Filled 2014-03-01: qty 1

## 2014-03-01 MED ORDER — DOCUSATE SODIUM 100 MG PO CAPS
100.0000 mg | ORAL_CAPSULE | Freq: Two times a day (BID) | ORAL | Status: DC
  Administered 2014-03-01 – 2014-03-02 (×2): 100 mg via ORAL

## 2014-03-01 MED ORDER — NEOSTIGMINE METHYLSULFATE 10 MG/10ML IV SOLN
INTRAVENOUS | Status: DC | PRN
  Administered 2014-03-01: 5 mg via INTRAVENOUS

## 2014-03-01 MED ORDER — INSULIN ASPART 100 UNIT/ML ~~LOC~~ SOLN
0.0000 [IU] | Freq: Three times a day (TID) | SUBCUTANEOUS | Status: DC
  Administered 2014-03-01: 3 [IU] via SUBCUTANEOUS
  Administered 2014-03-02: 5 [IU] via SUBCUTANEOUS

## 2014-03-01 MED ORDER — HYDROMORPHONE HCL 1 MG/ML IJ SOLN
INTRAMUSCULAR | Status: AC
  Administered 2014-03-01: 0.5 mg via INTRAVENOUS
  Filled 2014-03-01: qty 1

## 2014-03-01 SURGICAL SUPPLY — 43 items
BAG ZIPLOCK 12X15 (MISCELLANEOUS) IMPLANT
CHLORAPREP W/TINT 26ML (MISCELLANEOUS) IMPLANT
CLEANER TIP ELECTROSURG 2X2 (MISCELLANEOUS) ×2 IMPLANT
CLOTH 2% CHLOROHEXIDINE 3PK (PERSONAL CARE ITEMS) ×2 IMPLANT
DRAPE MICROSCOPE LEICA (MISCELLANEOUS) ×2 IMPLANT
DRAPE POUCH INSTRU U-SHP 10X18 (DRAPES) ×2 IMPLANT
DRAPE SURG 17X11 SM STRL (DRAPES) ×2 IMPLANT
DRAPE UTILITY XL STRL (DRAPES) ×2 IMPLANT
DRSG AQUACEL AG ADV 3.5X 4 (GAUZE/BANDAGES/DRESSINGS) IMPLANT
DRSG AQUACEL AG ADV 3.5X 6 (GAUZE/BANDAGES/DRESSINGS) ×2 IMPLANT
DURAPREP 26ML APPLICATOR (WOUND CARE) ×2 IMPLANT
DURASEAL SPINE SEALANT 3ML (MISCELLANEOUS) IMPLANT
ELECT BLADE TIP CTD 4 INCH (ELECTRODE) IMPLANT
ELECT REM PT RETURN 9FT ADLT (ELECTROSURGICAL) ×2
ELECTRODE REM PT RTRN 9FT ADLT (ELECTROSURGICAL) ×1 IMPLANT
GLOVE BIOGEL PI IND STRL 7.5 (GLOVE) ×1 IMPLANT
GLOVE BIOGEL PI INDICATOR 7.5 (GLOVE) ×1
GLOVE SURG SS PI 7.5 STRL IVOR (GLOVE) ×2 IMPLANT
GLOVE SURG SS PI 8.0 STRL IVOR (GLOVE) ×4 IMPLANT
GOWN STRL REUS W/TWL XL LVL3 (GOWN DISPOSABLE) ×4 IMPLANT
IV CATH 14GX2 1/4 (CATHETERS) IMPLANT
KIT BASIN OR (CUSTOM PROCEDURE TRAY) ×2 IMPLANT
KIT POSITIONING SURG ANDREWS (MISCELLANEOUS) ×2 IMPLANT
MANIFOLD NEPTUNE II (INSTRUMENTS) ×2 IMPLANT
NEEDLE SPNL 18GX3.5 QUINCKE PK (NEEDLE) ×4 IMPLANT
PACK LAMINECTOMY ORTHO (CUSTOM PROCEDURE TRAY) ×2 IMPLANT
PATTIES SURGICAL .5 X.5 (GAUZE/BANDAGES/DRESSINGS) ×2 IMPLANT
PATTIES SURGICAL .75X.75 (GAUZE/BANDAGES/DRESSINGS) IMPLANT
PATTIES SURGICAL 1X1 (DISPOSABLE) IMPLANT
SPONGE SURGIFOAM ABS GEL 100 (HEMOSTASIS) ×2 IMPLANT
STRIP CLOSURE SKIN 1/2X4 (GAUZE/BANDAGES/DRESSINGS) ×2 IMPLANT
SUT NURALON 4 0 TR CR/8 (SUTURE) IMPLANT
SUT PROLENE 3 0 PS 2 (SUTURE) ×2 IMPLANT
SUT VIC AB 1 CT1 27 (SUTURE)
SUT VIC AB 1 CT1 27XBRD ANTBC (SUTURE) IMPLANT
SUT VIC AB 1-0 CT2 27 (SUTURE) ×2 IMPLANT
SUT VIC AB 2-0 CT1 27 (SUTURE)
SUT VIC AB 2-0 CT1 TAPERPNT 27 (SUTURE) IMPLANT
SUT VIC AB 2-0 CT2 27 (SUTURE) ×2 IMPLANT
SYR 3ML LL SCALE MARK (SYRINGE) IMPLANT
TOWEL OR 17X26 10 PK STRL BLUE (TOWEL DISPOSABLE) ×2 IMPLANT
TOWEL OR NON WOVEN STRL DISP B (DISPOSABLE) ×2 IMPLANT
YANKAUER SUCT BULB TIP NO VENT (SUCTIONS) IMPLANT

## 2014-03-01 NOTE — Anesthesia Preprocedure Evaluation (Signed)
Anesthesia Evaluation  Patient identified by MRN, date of birth, ID band Patient awake    Reviewed: Allergy & Precautions, H&P , NPO status , Patient's Chart, lab work & pertinent test results  Airway Mallampati: II  TM Distance: >3 FB Neck ROM: full    Dental  (+) Missing, Dental Advisory Given All of front teeth are missing and most of the rest:   Pulmonary neg pulmonary ROS,  breath sounds clear to auscultation  Pulmonary exam normal       Cardiovascular Exercise Tolerance: Good hypertension, Pt. on medications Rhythm:regular Rate:Normal     Neuro/Psych negative neurological ROS  negative psych ROS   GI/Hepatic negative GI ROS, Neg liver ROS, GERD-  Medicated and Controlled,  Endo/Other  diabetes, Well Controlled, Type 2, Oral Hypoglycemic Agents  Renal/GU negative Renal ROS  negative genitourinary   Musculoskeletal   Abdominal   Peds  Hematology negative hematology ROS (+)   Anesthesia Other Findings   Reproductive/Obstetrics negative OB ROS                             Anesthesia Physical Anesthesia Plan  ASA: III  Anesthesia Plan: General   Post-op Pain Management:    Induction: Intravenous  Airway Management Planned: Oral ETT  Additional Equipment:   Intra-op Plan:   Post-operative Plan: Extubation in OR  Informed Consent: I have reviewed the patients History and Physical, chart, labs and discussed the procedure including the risks, benefits and alternatives for the proposed anesthesia with the patient or authorized representative who has indicated his/her understanding and acceptance.   Dental Advisory Given  Plan Discussed with: CRNA and Surgeon  Anesthesia Plan Comments:         Anesthesia Quick Evaluation

## 2014-03-01 NOTE — H&P (View-Only) (Signed)
[name] DOB: [birth year] Divorced / Language: Cleophus Molt / Race: White Female  H&P Date: 02/15/14  Chief complaint: back and R leg pain  History of Present Illness  The patient is a 58 year old female who presents for a Follow-up for Follow-up back. The patient is being followed for their right-sided back pain. They are now 1 day(s) out from [facility] (L-4). Symptoms reported today include: pain (low back), weakness (right leg) and leg pain (right). The patient states that they are doing poorly. Current treatment includes: NSAIDs (Duexis and Neurontin). The following medication has been used for pain control: Norco. The patient reports their current pain level to be severe and 10 / 10. The patient presents today following ESI. Note for "Follow-up back": The increased strength of Norco did help her some with pain, and Neurontin as well, however her pain has progressively worsened since her last visit. She is worst in the morning when she first gets up, better by the time she is at work and walking. She is worse sitting and lying down and better standing. Pain continues to radiate down the R buttock, lateral thigh to anterior lower leg to the ankle, no foot pain. Increased pain in the anterior, medial thigh region as well since her last visit. She has also noted some stiffness in her right knee. She is having trouble sleeping due to pain. Patient went to work Monday and had to leave due to pain. She underwent her ESI yesterday and reports zero relief. She called and spoke with Dr. Tonita Cong this morning about other options and he asked her to come in to discuss possible surgery. She is tearful and reports severe pain.  Problem List/Past Medical Hx Spinal stenosis, lumbar (M48.06) Lumbar radiculitis (M54.16) Diabetes Mellitus, Type II Diverticulitis Of Colon High blood pressure Hypercholesterolemia Syndrome, rotator cuff NOS  Bunion  Enthesopathy, hip Plantar fasciitis   Allergies  No Known  Drug Allergies  Family History First Degree Relatives Cancer mother and sister Congestive Heart Failure mother Liver Disease, Chronic sister Hypertension brother Diabetes Mellitus grandmother fathers side brother and child  Social History Children 1 Alcohol use current drinker; drinks beer; only occasionally per week Exercise Exercises rarely; does other Exercises monthly; does running / walking Drug/Alcohol Rehab (Previously) no Tobacco use never smoker Current work status working full time Drug/Alcohol Rehab (Currently) no Pain Contract no Tobacco / smoke exposure yes Marital status divorced Number of flights of stairs before winded greater than 5 Illicit drug use no Living situation live alone  Medication History  Neurontin (300MG  Capsule, 1 (one) Oral po q hs x 3 days, then 1 tab po BID x 3 days, then 1 tab TID, Taken starting 02/10/2014) Active. Norco (10-325MG  Tablet, 1 (one) Tablet Oral every 6-8 hours as needed for pain, Taken starting 02/07/2014) Active. Benazepril-Hydrochlorothiazide (10-12.5MG  Tablet, Oral) Active. Lovastatin (40MG  Tablet, Oral) Active. MetFORMIN HCl ER (500MG  Tablet ER 24HR, Oral) Active. ValACYclovir HCl (500MG  Tablet, Oral as needed) Active. Tylenol (325MG  Tablet, 2 Oral as directed) Active. Cyclobenzaprine HCl (10MG  Tablet, Oral) Active. Meloxicam (15MG  Tablet, Oral) Active. Vitamin C (1000MG  Tablet, 1 Oral) Active. Medications Reconciled  Pregnancy / Birth History  Pregnant no  Past Surgical History Tonsillectomy Tubal Ligation Foot Surgery left Spinal Surgery  Review of Systems General Not Present- Fever and Weight Loss. Skin Not Present- Rash. Cardiovascular Not Present- Chest Pain and Shortness of Breath. Musculoskeletal Present- Back Pain, Muscle Pain and Muscle Weakness. Not Present- Joint Swelling. Neurological Not Present- Difficulty with balance,  Loss of bladder control, Loss of bowel  control and Weakness. Endocrine Not Present- Excessive Thirst and Excessive Urination. Hematology Not Present- Easy Bruising.  Physical Exam  General Mental Status -Alert. General Appearance-pleasant, In acute distress, Tearful, appears uncomfortable(alternates between standing and sitting, leaning left). Orientation-Oriented X3. Build & Nutrition-Well nourished and Well developed. Gait-Antalgic.  Abdomen Palpation/Percussion Tenderness - Abdomen is non-tender to palpation. Rigidity (guarding) - Abdomen is soft.  Peripheral Vascular Lower Extremity Palpation - Homan's sign - Bilateral - Negative (normal). Posterior tibial pulse - Bilateral - 2+. Dorsalis pedis pulse - Bilateral - 2+.  Neurologic Motor Strength - Hip Flexion - Bilateral - 5/5. Quadriceps - Left - 5/5. Right - 5/5(trace weakness, 5-/5). Hamstrings - Left - 5/5. Right - 5/5(trace weakness, 5-/5). Ankle Dorsiflexion - Bilateral - 5/5. Ankle Plantarflexion - Bilateral - 5/5. Extensor Hallucis Longus - Left - 5/5. Right - 4/5(4+/5). Sensation Lower Extremity - Bilateral - sensation is intact to light touch in the lower extremity. Reflexes Patellar Reflex - Bilateral - 2+. Achilles Reflex - Bilateral - 2+. Babinski - Bilateral - Babinski not present. Clonus - Bilateral - clonus not present.  Musculoskeletal Spine/Ribs/Pelvis Lumbosacral Spine: Inspection and Palpation - Tenderness - right buttock is tender to palpation and right greater trochanter is tender to palpation, no tenderness to palpation of the lumbar spinous processes, no tenderness to palpation of the left flank, no tenderness to palpation of right flank, no tenderness to palpation of left lumbar paraspinals, no tenderness to palpation of right lumbar paraspinals, no tenderness to palpation of the left greater trochanter. Swelling - none. Surrounding tissue tension/texture is - soft. Sensation - normal. Other characteristics - no ecchymosis, no  abnormal warmth, no erythema, no evidence of cellulitis. ROM - Flexion - moderately decreased range of motion and painful. Extension - moderately decreased range of motion and painful. Testing limited - due to pain. Special Testing - Lumbar - Right seated straight leg raise positive(produces pain R buttock, thigh, lower leg), Left seated straight leg raise negative. Lower Extremity  Right Lower Extremity: Right Hip - ROM - Full ROM of the hip and pain-free. Right Knee - ROM - Full ROM of the knee and pain-free. Right Ankle - ROM - Full and pain-free ROM of the ankle. Left Lower Extremity: Left Hip - ROM - Full ROM of the hip and pain-free. Left Knee - ROM - Full ROM of the knee and pain-free. Left Ankle - ROM - Full and pain-free ROM of the ankle.  Lymphatic General Lymphatics Description - No Localized lymphadenopathy.  Imaging MRI images and report reviewed today by Dr. Tonita Cong with multilevel DDD. Post-op changes L5-S1 left with small left sided disc extrusion, asymptomatic. Right sided lateral recess stenosis L3-4 and L4-5 with probable encroachment on the L4 and L5 nerve roots, respectively. HNP L3-4 with right posterolateral extruded fragment. This is likely underestimated due to pt position.  Assessment & Plan Low back pain radiating to right lower extremity (M54.5) Lumbar radiculitis (M54.16) Lumbar HNP (M51.26) Spinal stenosis, lumbar (M48.06)  Pt nearly 8 years s/p decompression L5-S1 left with new onset RLE symptoms x approx 6.5 weeks without injury or change in activity, with myotomal weakness and dermatomal dysthesias, neural tension signs, refractory to NSAIDs, pain medications, neurontin, relative rest, activity modifications, HEP, progressively worsening pain. We again discussed relevant anatomy, MRI, lateral recess stenosis at L3-4 and L4-5 on the right, extruded disc fragment L3-4, in combination they are causing her RLE symptoms, multiple dermatomes as there are multiple nerve  roots affected. She does have EHL weakness consistent with L5 distribution. Discussed tx options, thus far refractory to conservative tx including recent ESI at L4-5. She likely would not benefit from further ESI's. Given her ongoing weakness, neural tension signs, and severe pain, recommend proceeding with microlumbar decompression L3-4, L4-5 right. Discussed the procedure itself as well as risks, complications, and alternatives, including but not limited to DVT, PE, infx, bleeding, failure of procedure, need for secondary procedure, nerve injury, ongoing pain/symptoms, dural tear, CSF leak, and anesthesia risk, even stroke or death. Also discussed typical post-op course as well as spine precautions: no lifting, bending, twisting, prolonged sitting x 6 weeks post-op. Discussed walking program, PT, gentle stretching post-op. All questions were answered. Patient desires to proceed with surgery. She understands the intention of this procedure is to make an unbearable situation bearable. We discussed her underlying DDD and possible ongoing lower back pain, the importance of activity modifications long term to avoid recurrent injury. Her sugars have been better controlled. We will obtain a pre-op A1C and other appropriate labwork. We will proceed accordingly. She will continue working 5-10 hr days depending on her pain level, light duty, in the interim. If unable to tolerate work she will let us know. She will follow up 10-14 days post-op for suture removal and will call with any questions or concerns. Refills by request.  I had an extensive discussion of the risks and benefits of the lumbar decompression with the patient including bleeding, infection, damage to neurovascular structures, epidural fibrosis, CSF leak requiring repair. We also discussed increase in pain, adjacent segment disease, recurrent disc herniation, need for future surgery including repeat decompression and/or fusion. We also discussed risks of  postoperative hematoma, paralysis, anesthetic complications including DVT, PE, death, cardiopulmonary dysfunction. In addition, the perioperative and postoperative courses were discussed in detail including the rehabilitative time and return to functional activity and work. I provided the patient with an illustrated handout and utilized the appropriate surgical models.  Signed electronically by Lacie Draft PA-C for Dr. Tonita Cong

## 2014-03-01 NOTE — Evaluation (Signed)
Physical Therapy Evaluation & Discharge Summary Patient Details Name: [name] MRN: [id number] DOB: May 05, 1955 Today's Date: 03/01/2014   History of Present Illness  Spinal stenosis, herniated nucleus pulposus, 3-4, 4-5, right; now s/p decompression/laminectomy.  Clinical Impression  Patient tolerated mobility after above procedure well.  She needed reminders about precautions and instructions issued.  Mobilizing well even without walker and no further skilled PT needs at this time.      Follow Up Recommendations No PT follow up    Equipment Recommendations  None recommended by PT    Recommendations for Other Services       Precautions / Restrictions Precautions Precautions: Back Precaution Booklet Issued: Yes (comment) Restrictions Weight Bearing Restrictions: No      Mobility  Bed Mobility Overal bed mobility: Needs Assistance Bed Mobility: Sit to Supine;Supine to Sit     Supine to sit: Supervision Sit to supine: Supervision   General bed mobility comments: cues for technique  Transfers Overall transfer level: Modified independent Equipment used: Rolling walker (2 wheeled)             General transfer comment: cues for precautions  Ambulation/Gait Ambulation/Gait assistance: Supervision;Min guard Ambulation Distance (Feet): 200 Feet Assistive device: Rolling walker (2 wheeled) Gait Pattern/deviations: Step-through pattern;Decreased stride length     General Gait Details: walked few feet in room no device.    Stairs            Wheelchair Mobility    Modified Rankin (Stroke Patients Only)       Balance Overall balance assessment: Independent                                           Pertinent Vitals/Pain Pain Assessment: 0-10 Pain Score: 3  Pain Intervention(s): Monitored during session    Home Living Family/patient expects to be discharged to:: Private residence Living Arrangements: Children Available  Help at Discharge: Family Type of Home: House Home Access: Stairs to enter Entrance Stairs-Rails: None Technical brewer of Steps: 1 Home Layout: One level Home Equipment: None      Prior Function Level of Independence: Independent         Comments: works full time     Journalist, newspaper        Extremity/Trunk Assessment   Upper Extremity Assessment: Defer to OT evaluation           Lower Extremity Assessment: Overall WFL for tasks assessed         Communication   Communication: No difficulties  Cognition Arousal/Alertness: Awake/alert Behavior During Therapy: WFL for tasks assessed/performed Overall Cognitive Status: Within Functional Limits for tasks assessed                      General Comments General comments (skin integrity, edema, etc.): educated on precautions with handout given.  Noted patient twisting to ensure gown closed in back, reminders about not twisting, arching, or bending.     Exercises        Assessment/Plan    PT Assessment Patent does not need any further PT services  PT Diagnosis Acute pain   PT Problem List    PT Treatment Interventions     PT Goals (Current goals can be found in the Care Plan section) Acute Rehab PT Goals PT Goal Formulation: All assessment and education complete, DC therapy    Frequency  Barriers to discharge        Co-evaluation               End of Session Equipment Utilized During Treatment: Gait belt Activity Tolerance: Patient tolerated treatment well Patient left: in bed;with call bell/phone within reach;with family/visitor present Nurse Communication: Mobility status         Time: 6195-0932 PT Time Calculation (min) (ACUTE ONLY): 17 min   Charges:   PT Evaluation $Initial PT Evaluation Tier I: 1 Procedure PT Treatments $Gait Training: 8-22 mins   PT G Codes:          Tiernan Millikin,CYNDI 03/20/14, 4:39 PM Magda Kiel, [place] March 20, 2014

## 2014-03-01 NOTE — Op Note (Signed)
[name], [name]             ACCOUNT NO.:  [id number]  MEDICAL RECORD NO.:  [id number]  LOCATION:  [facility]                         FACILITY:  [facility]  PHYSICIAN:  Susa Day, M.D.    DATE OF BIRTH:  11-17-1955  DATE OF PROCEDURE: DATE OF DISCHARGE:                              OPERATIVE REPORT   PREOPERATIVE DIAGNOSIS:  Spinal stenosis, herniated nucleus pulposus, 3- 4, 4-5, right.  POSTOPERATIVE DIAGNOSIS:  Spinal stenosis, herniated nucleus pulposus, 3- 4, 4-5, right.  PROCEDURE PERFORMED: 1. Micro lumbar decompression L3-4, L4-5, right. 2. Foraminotomies of L4, L5, right. 3. Hemilaminectomy of L4, right with hemilaminotomies of 3-4 and 4-5.  ANESTHESIA:  General.  ASSISTANT:  Jane Draft, PA.  HISTORY:  A 58, right lower extremity radicular pain, L5 nerve root distribution, L4 nerve root distribution secondary lateral recess stenosis, spinal stenosis, HNP at 3-4, compressing the 5 root and lateral recess in the 4 root from the disk protrusion at 3-4.  She had disk degeneration at 3-4, partial relief from the epidural at 4-5 indicated for decompression at 4-5 and 5-1 foraminotomies, evaluate 4 and the 5 roots.  Risks and benefits discussed including bleeding, infection, damage to neurovascular structures, DVT, PE, anesthetic complications, no change in symptoms, worsening symptoms, need for fusion in the future, etc.  TECHNIQUE:  With the patient in supine position, after induction of adequate general anesthesia, 2 g of Kefzol, placed prone on the [place] frame.  All bony prominences were well padded.  Lumbar region was prepped and draped in usual sterile fashion.  Two 18-gauge spinal needle was utilized to localize 3-4, 4-5 interspace confirmed with x-ray. Incision was made from the spinous process of 3 to below 5. Subcutaneous tissue was dissected.  Electrocautery was utilized to achieve hemostasis.  Dorsolumbar fascia identified and divided in line with the  skin incision.  Paraspinous muscle elevated from lamina 3-4, 4- 5.  McCullough retractor was placed.  Confirmatory radiograph obtained. Operating microscope was draped and brought into surgical field. Leksell rongeur was utilized to remove part of the lamina of 4.  It was then removed with a 2 and a 3 mm Kerrison.  It was noted to be compressing the 4.  We rooted the top of the neural arch.  I performed foraminotomy of 4, decompressed lateral recess at 4-5 to the medial border of the pedicle and performed a generous foraminotomy of 5 with a neural patty placed beneath the ligamentum flavum, protecting the nerve root at all times.  Significant compression of the 5 root was noted in the foramen and the lateral recess disk.  There was no disk herniation in 4-5.  Epidural venous plexus however was noted under the area of decompression.  We continued the decompression cephalad, removing the hemilamina of 4 preserving the pars.  We removed the ligamentum from 3- 4, decompressed the lateral recess 3-4 as well.  We evaluated the disk at 3-4.  There was a small disk bulge noted, but no definitive rupture at this point.  We checked out the foramen of 4 and 5 and 3 with a neural probe, they were widely patent, 1 cm of excursion of the 4 and the 5 root medial pedicle  without tension.  The confirmatory radiograph obtained with Penfield at the disk space at 3-4.  Epidural venous plexus was noted on the lateral recess at 3-4 as well indicating the aortic stenosis improved after the decompression. Good restoration of thecal sac.  No CSF leakage.  Bipolar electrocautery was utilized to achieve hemostasis.  I placed thrombin-soaked Gelfoam in the decompression defect.  Removed the McCullough retractor, irrigated the paraspinous musculature, closed the fascia with 1 Vicryl, subcu with 2-0 and skin with subcuticular Prolene.  Sterile dressing applied, placed supine on the hospital bed, extubated without  difficulty, and transported to the recovery room in satisfactory condition.  The patient tolerated the procedure well.  No complications.  Minimal blood loss.     Susa Day, M.D.     [name]  D:  03/01/2014  T:  03/01/2014  Job:  [id number]

## 2014-03-01 NOTE — Transfer of Care (Signed)
Immediate Anesthesia Transfer of Care Note  Patient: [name]  Procedure(s) Performed: Procedure(s): MICRO LUMBAR DECOMPRESSION L3 - L4 and L4 - L5 ON THE RIGHT  2 LEVELS (Right)  Patient Location: PACU  Anesthesia Type:General  Level of Consciousness: Patient easily awoken, sedated, comfortable, cooperative, following commands, responds to stimulation.   Airway & Oxygen Therapy: Patient spontaneously breathing, ventilating well, oxygen via simple oxygen mask.  Post-op Assessment: Report given to PACU RN, vital signs reviewed and stable, moving all extremities.   Post vital signs: Reviewed and stable.  Complications: No apparent anesthesia complications

## 2014-03-01 NOTE — Interval H&P Note (Signed)
History and Physical Interval Note:  03/01/2014 7:30 AM  [name]  has presented today for surgery, with the diagnosis of HNP and Stenosis  The various methods of treatment have been discussed with the patient and family. After consideration of risks, benefits and other options for treatment, the patient has consented to  Procedure(s): MICRO LUMBAR DECOMPRESSION L3 - L4 and L4 - L5 ON THE RIGHT  2 LEVELS (Right) as a surgical intervention .  The patient's history has been reviewed, patient examined, no change in status, stable for surgery.  I have reviewed the patient's chart and labs.  Questions were answered to the patient's satisfaction.     Jakyrah Holladay C

## 2014-03-01 NOTE — Brief Op Note (Signed)
03/01/2014  11:08 AM  PATIENT:  [name]  58 y.o. female  PRE-OPERATIVE DIAGNOSIS:  HNP and Stenosis  POST-OPERATIVE DIAGNOSIS:  HNP and Stenosis  PROCEDURE:  Procedure(s): MICRO LUMBAR DECOMPRESSION L3 - L4 and L4 - L5 ON THE RIGHT  2 LEVELS (Right)  SURGEON:  Surgeon(s) and Role:    * Johnn Hai, MD - Primary  PHYSICIAN ASSISTANT:   ASSISTANTS: Bissell   ANESTHESIA:   general  EBL:  Total I/O In: -  Out: 250 [Urine:250]  BLOOD ADMINISTERED:none  DRAINS: none   LOCAL MEDICATIONS USED:  MARCAINE     SPECIMEN:  No Specimen  DISPOSITION OF SPECIMEN:  N/A  COUNTS:  YES  TOURNIQUET:  * No tourniquets in log *  DICTATION: .Other Dictation: Dictation Number [id number]  PLAN OF CARE: Admit for overnight observation  PATIENT DISPOSITION:  PACU - hemodynamically stable.   Delay start of Pharmacological VTE agent (>24hrs) due to surgical blood loss or risk of bleeding: yes

## 2014-03-01 NOTE — Anesthesia Postprocedure Evaluation (Signed)
  Anesthesia Post-op Note  Patient: [name]  Procedure(s) Performed: Procedure(s) (LRB): MICRO LUMBAR DECOMPRESSION L3 - L4 and L4 - L5 ON THE RIGHT  2 LEVELS (Right)  Patient Location: PACU  Anesthesia Type: General  Level of Consciousness: awake and alert   Airway and Oxygen Therapy: Patient Spontanous Breathing  Post-op Pain: mild  Post-op Assessment: Post-op Vital signs reviewed, Patient's Cardiovascular Status Stable, Respiratory Function Stable, Patent Airway and No signs of Nausea or vomiting  Last Vitals:  Filed Vitals:   03/01/14 1145  BP: 127/58  Pulse: 86  Temp:   Resp: 13    Post-op Vital Signs: stable   Complications: No apparent anesthesia complications

## 2014-03-01 NOTE — Interval H&P Note (Signed)
History and Physical Interval Note:  03/01/2014 7:36 AM  [name]  has presented today for surgery, with the diagnosis of HNP and Stenosis  The various methods of treatment have been discussed with the patient and family. After consideration of risks, benefits and other options for treatment, the patient has consented to  Procedure(s): MICRO LUMBAR DECOMPRESSION L3 - L4 and L4 - L5 ON THE RIGHT  2 LEVELS (Right) as a surgical intervention .  The patient's history has been reviewed, patient examined, no change in status, stable for surgery.  I have reviewed the patient's chart and labs.  Questions were answered to the patient's satisfaction.     Yasha Tibbett C

## 2014-03-01 NOTE — Discharge Instructions (Signed)

## 2014-03-01 NOTE — Anesthesia Procedure Notes (Signed)
Procedure Name: Intubation Date/Time: 03/01/2014 9:40 AM Performed by: Tonita Cong, JEFFREY C Pre-anesthesia Checklist: Patient identified, Emergency Drugs available, Suction available and Patient being monitored Patient Re-evaluated:Patient Re-evaluated prior to inductionOxygen Delivery Method: Circle system utilized Intubation Type: IV induction Ventilation: Mask ventilation without difficulty Laryngoscope Size: Mac and 3 Tube type: Oral Tube size: 7.5 mm Number of attempts: 1 Placement Confirmation: ETT inserted through vocal cords under direct vision,  breath sounds checked- equal and bilateral and positive ETCO2 Secured at: 21 cm Tube secured with: Tape Dental Injury: Teeth and Oropharynx as per pre-operative assessment

## 2014-03-02 ENCOUNTER — Encounter (HOSPITAL_COMMUNITY): Payer: Self-pay | Admitting: Specialist

## 2014-03-02 DIAGNOSIS — M5126 Other intervertebral disc displacement, lumbar region: Secondary | ICD-10-CM | POA: Diagnosis not present

## 2014-03-02 LAB — BASIC METABOLIC PANEL
ANION GAP: 3 — AB (ref 5–15)
BUN: 15 mg/dL (ref 6–23)
CO2: 29 mmol/L (ref 19–32)
CREATININE: 0.75 mg/dL (ref 0.50–1.10)
Calcium: 9 mg/dL (ref 8.4–10.5)
Chloride: 107 mEq/L (ref 96–112)
Glucose, Bld: 150 mg/dL — ABNORMAL HIGH (ref 70–99)
Potassium: 4.2 mmol/L (ref 3.5–5.1)
Sodium: 139 mmol/L (ref 135–145)

## 2014-03-02 LAB — GLUCOSE, CAPILLARY: GLUCOSE-CAPILLARY: 201 mg/dL — AB (ref 70–99)

## 2014-03-02 NOTE — Progress Notes (Signed)
Physical Therapy G-Code Note:    2014-03-18 1700  PT G-Codes **NOT FOR INPATIENT CLASS**  Functional Assessment Tool Used Clinical Judgement  Functional Limitation Mobility: Walking and moving around  Mobility: Walking and Moving Around Current Status [phone]) CI  Mobility: Walking and Moving Around Goal Status [phone]) CI  Mobility: Walking and Moving Around Discharge Status [phone]) CI   [place], [place] 03/02/2014

## 2014-03-02 NOTE — Progress Notes (Signed)
Pt to d/c home. AVS reviewed and "My Chart" discussed with pt. Pt capable of verbalizing medications, signs and symptoms of infection, and follow-up appointments. Remains hemodynamically stable. No signs and symptoms of distress. Educated pt to return to ER in the case of SOB, dizziness, or chest pain.  

## 2014-03-02 NOTE — Care Management Note (Signed)
    Page 1 of 1   03/02/2014     10:52:59 AM CARE MANAGEMENT NOTE 03/02/2014  Patient:  KAZIA, GRISANTI   Account Number:  [id number]  Date Initiated:  03/09/2014  Documentation initiated by:  [facility]  Subjective/Objective Assessment:   ADM: 1. Micro lumbar decompression L3-4, L4-5, right.  2. Foraminotomies of L4, L5, right.  3. Hemilaminectomy of L4, right with hemilaminotomies of 3-4 and 4-5.     Action/Plan:   DISCHARGE PLANNING   Anticipated DC Date:  03/02/2014   Anticipated DC Plan:  [place]  CM consult      Choice offered to / List presented to:             Status of service:   Medicare Important Message given?   (If response is "NO", the following Medicare IM given date fields will be blank) Date Medicare IM given:   Medicare IM given by:   Date Additional Medicare IM given:   Additional Medicare IM given by:    Discharge Disposition:  HOME/SELF CARE  Per UR Regulation:    If discussed at Long Length of Stay Meetings, dates discussed:    Comments:  03/02/24 07:30 No CM needs communicated; chargt reviewed. Mariane Masters, BSn, Cm [phone].

## 2014-03-02 NOTE — Evaluation (Signed)
Occupational Therapy Evaluation Patient Details Name: [name] MRN: [id number] DOB: 1955/05/24 Today's Date: 03/02/2014    History of Present Illness Spinal stenosis, herniated nucleus pulposus, 3-4, 4-5, right; now s/p decompression/laminectomy.   Clinical Impression   Education complete regarding ADL activity s/p back surgery    Follow Up Recommendations  No OT follow up    Equipment Recommendations  None recommended by OT    Recommendations for Other Services       Precautions / Restrictions Precautions Precautions: Back Restrictions Weight Bearing Restrictions: No      Mobility Bed Mobility Overal bed mobility: Needs Assistance Bed Mobility: Sit to Supine;Supine to Sit     Supine to sit: Supervision Sit to supine: Supervision   General bed mobility comments: cues for technique  Transfers Overall transfer level: Modified independent               General transfer comment: cues for precautions    Balance                                            ADL Overall ADL's : Needs assistance/impaired                                       General ADL Comments: Pt overall S with ADL activity with cues for back precautions. son will A as needed     Vision                     Perception     Praxis      Pertinent Vitals/Pain Pain Score: 1  Pain Location: sore Pain Intervention(s): Monitored during session     Hand Dominance     Extremity/Trunk Assessment Upper Extremity Assessment Upper Extremity Assessment: Overall WFL for tasks assessed           Communication Communication Communication: No difficulties   Cognition Arousal/Alertness: Awake/alert Behavior During Therapy: WFL for tasks assessed/performed Overall Cognitive Status: Within Functional Limits for tasks assessed                     General Comments       Exercises       Shoulder Instructions      Home  Living Family/patient expects to be discharged to:: Private residence Living Arrangements: Children Available Help at Discharge: Family Type of Home: House Home Access: Stairs to enter Technical brewer of Steps: 1 Entrance Stairs-Rails: None Home Layout: One level     Bathroom Shower/Tub: Teacher, early years/pre: Standard     Home Equipment: None          Prior Functioning/Environment Level of Independence: Independent        Comments: works full time                           Chief Executive Officer of Session Nurse Communication: Mobility status  Activity Tolerance: Patient tolerated treatment well Patient left: in chair;with family/visitor present   Time: 4492-0100 OT Time Calculation (min): 17 min Charges:  OT General Charges $OT Visit: 1 Procedure OT Evaluation $Initial OT Evaluation Tier I: 1 Procedure OT Treatments $Self Care/Home  Management : 8-22 mins G-Codes: OT G-codes **NOT FOR INPATIENT CLASS** Functional Assessment Tool Used: clnical observation Functional Limitation: Self care Self Care Current Status (E0100): At least 1 percent but less than 20 percent impaired, limited or restricted Self Care Goal Status (F1219): 0 percent impaired, limited or restricted Self Care Discharge Status [phone]): At least 1 percent but less than 20 percent impaired, limited or restricted  [place], Thereasa Parkin 03/02/2014, 9:02 AM

## 2014-03-06 NOTE — Discharge Summary (Signed)
Physician Discharge Summary   Patient ID: [name] MRN: [id number] DOB/AGE: [birth year] 58 y.o.  Admit date: 03/01/2014 Discharge date: 03/02/14  Primary Diagnosis:   HNP and Stenosis  Admission Diagnoses:  Past Medical History  Diagnosis Date  . Diabetes mellitus without complication   . Hypertension   . GERD (gastroesophageal reflux disease)   . Neuromuscular disorder     nerve pain down right leg  . Arthritis     arhtritis back and hands   Discharge Diagnoses:   Principal Problem:   Spinal stenosis of lumbar region Active Problems:   Spinal stenosis at L4-L5 level  Procedure:  Procedure(s) (LRB): MICRO LUMBAR DECOMPRESSION L3 - L4 and L4 - L5 ON THE RIGHT  2 LEVELS (Right)   Consults: None  HPI:  see H&P    Laboratory Data: Hospital Outpatient Visit on 02/23/2014  Component Date Value Ref Range Status  . MRSA, PCR 02/23/2014 NEGATIVE  NEGATIVE Final  . Staphylococcus aureus 02/23/2014 NEGATIVE  NEGATIVE Final   Comment:        The Xpert SA Assay (FDA approved for NASAL specimens in patients over 15 years of age), is one component of a comprehensive surveillance program.  Test performance has been validated by [employer] for patients greater than or equal to 47 year old. It is not intended to diagnose infection nor to guide or monitor treatment.   . Hgb A1c MFr Bld 02/23/2014 7.0* <5.7 % Final   Comment: (NOTE)                                                                       According to the ADA Clinical Practice Recommendations for 2011, when HbA1c is used as a screening test:  >=6.5%   Diagnostic of Diabetes Mellitus           (if abnormal result is confirmed) 5.7-6.4%   Increased risk of developing Diabetes Mellitus References:Diagnosis and Classification of Diabetes Mellitus,Diabetes UMPN,3614,43(XVQMG 1):S62-S69 and Standards of Medical Care in         Diabetes - 2011,Diabetes QQPY,1950,93 (Suppl 1):S11-S61.   . Mean Plasma  Glucose 02/23/2014 154* <117 mg/dL Final   Performed at [facility]  . Sodium 02/23/2014 139  137 - 147 mEq/L Final  . Potassium 02/23/2014 4.8  3.7 - 5.3 mEq/L Final  . Chloride 02/23/2014 101  96 - 112 mEq/L Final  . CO2 02/23/2014 26  19 - 32 mEq/L Final  . Glucose, Bld 02/23/2014 120* 70 - 99 mg/dL Final  . BUN 02/23/2014 19  6 - 23 mg/dL Final  . Creatinine, Ser 02/23/2014 0.80  0.50 - 1.10 mg/dL Final  . Calcium 02/23/2014 10.3  8.4 - 10.5 mg/dL Final  . GFR calc non Af Amer 02/23/2014 79* >90 mL/min Final  . GFR calc Af Amer 02/23/2014 >90  >90 mL/min Final   Comment: (NOTE) The eGFR has been calculated using the CKD EPI equation. This calculation has not been validated in all clinical situations. eGFR's persistently <90 mL/min signify possible Chronic Kidney Disease.   . Anion gap 02/23/2014 12  5 - 15 Final  . WBC 02/23/2014 9.0  4.0 - 10.5 K/uL Final  . RBC 02/23/2014 4.31  3.87 - 5.11 MIL/uL  Final  . Hemoglobin 02/23/2014 12.9  12.0 - 15.0 g/dL Final  . HCT 02/23/2014 40.5  36.0 - 46.0 % Final  . MCV 02/23/2014 94.0  78.0 - 100.0 fL Final  . MCH 02/23/2014 29.9  26.0 - 34.0 pg Final  . MCHC 02/23/2014 31.9  30.0 - 36.0 g/dL Final  . RDW 02/23/2014 12.6  11.5 - 15.5 % Final  . Platelets 02/23/2014 353  150 - 400 K/uL Final   No results for input(s): HGB in the last 72 hours. No results for input(s): WBC, RBC, HCT, PLT in the last 72 hours. No results for input(s): NA, K, CL, CO2, BUN, CREATININE, GLUCOSE, CALCIUM in the last 72 hours. No results for input(s): LABPT, INR in the last 72 hours.  X-Rays:Dg Chest 2 View  02/23/2014   CLINICAL DATA:  Preop for lumbar spine surgery  EXAM: CHEST  2 VIEW  COMPARISON:  Chest x-ray 03/16/2006  FINDINGS: No active infiltrate or effusion is seen. Mediastinal and hilar contours are unremarkable. The heart is within normal limits in size. No bony abnormality is seen.  IMPRESSION: No active cardiopulmonary disease.    Electronically Signed   By: Ivar Drape M.D.   On: 02/23/2014 10:12   Dg Lumbar Spine 2-3 Views  02/23/2014   CLINICAL DATA:  DJD.  EXAM: LUMBAR SPINE - 2-3 VIEW  COMPARISON:  03/26/2006.  FINDINGS: Mild diffuse degenerative change. Mild disc space loss noted L2-L3 and L3-L4 . No acute abnormality. Normal alignment and mineralization. Mild aortoiliac atherosclerotic vascular calcification.  IMPRESSION: Mild diffuse degenerative change, no acute bony abnormality identified.   Electronically Signed   By: Marcello Moores  Register   On: 02/23/2014 10:13   Dg Spine Portable 1 View  03/01/2014   CLINICAL DATA:  Low back pain  EXAM: PORTABLE SPINE - 1 VIEW  COMPARISON:  Study obtained earlier in the day  FINDINGS: Lateral lumbar spine image labeled 3. There is a metallic probe with its tip just posterior to the superior most aspect of the L4 vertebral body. A second metallic probe has its tip posterior to the mid L4 vertebral body. There is no fracture or spondylolisthesis. There is mild disc space narrowing at L2-3 and L3-4.  IMPRESSION: Metallic probe tips are posterior to the superior aspect of the L4 vertebral body and the mid aspect of the L4 vertebral body respectively.   Electronically Signed   By: Lowella Grip M.D.   On: 03/01/2014 10:57   Dg Spine Portable 1 View  03/01/2014   CLINICAL DATA:  Back pain.  Surgical localization.  EXAM: PORTABLE SPINE - 1 VIEW  COMPARISON:  Multiple priors.  FINDINGS: Intraoperative lateral image number 2 demonstrates blunt probes directed most closely at the L4 and L5 pedicles. The spinous processes are numbered as an aid in localization.  IMPRESSION: As above.   Electronically Signed   By: Rolla Flatten M.D.   On: 03/01/2014 10:22   Dg Spine Portable 1 View  03/01/2014   CLINICAL DATA:  Lumbar disc disease.  EXAM: PORTABLE SPINE - 1 VIEW  COMPARISON:  02/23/2014  FINDINGS: There are needles at the inferior aspect of the spinous process of L3 and at the tip of the  spinous process of L5.  IMPRESSION: Needles at L3 and L5.   Electronically Signed   By: Rozetta Nunnery M.D.   On: 03/01/2014 10:06    EKG: Orders placed or performed in visit on 02/23/14  . EKG 12-Lead  Hospital Course: Patient was admitted to [facility] and taken to the OR and underwent the above state procedure without complications.  Patient tolerated the procedure well and was later transferred to the recovery room and then to the orthopaedic floor for postoperative care.  They were given PO and IV analgesics for pain control following their surgery.  They were given 24 hours of postoperative antibiotics.   PT was consulted postop to assist with mobility and transfers.  The patient was allowed to be WBAT with therapy and was taught back precautions. Discharge planning was consulted to help with postop disposition and equipment needs.  Patient had a good night on the evening of surgery and started to get up OOB with therapy on day one. Patient was seen in rounds and was ready to go home on day one.  They were given discharge instructions and dressing directions.  They were instructed on when to follow up in the office with Dr. Tonita Cong.   Diet: Diabetic diet Activity:WBAT; L-spine precautions Follow-up:in 10-14 days Disposition - Home Discharged Condition: good   Discharge Instructions    Call MD / Call 911    Complete by:  As directed   If you experience chest pain or shortness of breath, CALL 911 and be transported to the hospital emergency room.  If you develope a fever above 101 F, pus (white drainage) or increased drainage or redness at the wound, or calf pain, call your surgeon's office.     Constipation Prevention    Complete by:  As directed   Drink plenty of fluids.  Prune juice may be helpful.  You may use a stool softener, such as Colace (over the counter) 100 mg twice a day.  Use MiraLax (over the counter) for constipation as needed.     Diet - low sodium heart healthy     Complete by:  As directed      Increase activity slowly as tolerated    Complete by:  As directed             Medication List    STOP taking these medications        cyclobenzaprine 10 MG tablet  Commonly known as:  FLEXERIL     HYDROcodone-acetaminophen 10-325 MG per tablet  Commonly known as:  NORCO      TAKE these medications        acetaminophen 500 MG tablet  Commonly known as:  TYLENOL  Take 1,000-2,000 mg by mouth 2 (two) times daily as needed (Pain).  Notes to Patient:  Do not take additional Tylenol while taking Percocet.      benazepril-hydrochlorthiazide 10-12.5 MG per tablet  Commonly known as:  LOTENSIN HCT  Take 1 tablet by mouth every morning.     Chromium Picolinate 800 MCG Tabs  Take 800 mcg by mouth daily.     docusate sodium 100 MG capsule  Commonly known as:  COLACE  Take 1 capsule (100 mg total) by mouth 2 (two) times daily as needed for mild constipation.  Notes to Patient:  Can take twice a day until you have a BM.      gabapentin 300 MG capsule  Commonly known as:  NEURONTIN  Take 300 mg by mouth 3 (three) times daily.     lovastatin 40 MG tablet  Commonly known as:  MEVACOR  Take 40 mg by mouth at bedtime. Takes AM     meloxicam 15 MG tablet  Commonly known as:  MOBIC  Take 15 mg by mouth daily.     metFORMIN 500 MG 24 hr tablet  Commonly known as:  GLUCOPHAGE-XR  Take 500 mg by mouth 2 (two) times daily.     methocarbamol 500 MG tablet  Commonly known as:  ROBAXIN  Take 1 tablet (500 mg total) by mouth 3 (three) times daily.     oxyCODONE-acetaminophen 5-325 MG per tablet  Commonly known as:  PERCOCET  Take 1 tablet by mouth every 4 (four) hours as needed.     Potassium Gluconate 550 MG Tabs  Take 550 mg by mouth daily.     ranitidine 150 MG tablet  Commonly known as:  ZANTAC  Take 150 mg by mouth daily.     vitamin C 1000 MG tablet  Take 1,000 mg by mouth daily.           Follow-up Information    Follow up with  BEANE,JEFFREY C, MD In 2 weeks.   Specialty:  Orthopedic Surgery   Why:  For suture removal   Contact information:   [address] [place] [address] [phone]       Signed: Lacie Draft, PA-C Orthopaedic Surgery 03/06/2014, 8:34 AM

## 2014-05-16 ENCOUNTER — Other Ambulatory Visit: Payer: Self-pay

## 2014-05-16 DIAGNOSIS — Z1231 Encounter for screening mammogram for malignant neoplasm of breast: Secondary | ICD-10-CM

## 2014-05-29 ENCOUNTER — Ambulatory Visit
Admission: RE | Admit: 2014-05-29 | Discharge: 2014-05-29 | Disposition: A | Discharge status: Home / Self Care | Payer: 59 | Source: Ambulatory Visit

## 2014-05-29 DIAGNOSIS — Z1231 Encounter for screening mammogram for malignant neoplasm of breast: Secondary | ICD-10-CM

## 2015-06-13 ENCOUNTER — Other Ambulatory Visit: Payer: Self-pay

## 2015-06-13 DIAGNOSIS — Z1231 Encounter for screening mammogram for malignant neoplasm of breast: Secondary | ICD-10-CM

## 2015-07-03 ENCOUNTER — Ambulatory Visit
Admission: RE | Admit: 2015-07-03 | Discharge: 2015-07-03 | Disposition: A | Discharge status: Home / Self Care | Payer: BLUE CROSS/BLUE SHIELD | Source: Ambulatory Visit

## 2015-07-03 DIAGNOSIS — E119 Type 2 diabetes mellitus without complications: Secondary | ICD-10-CM | POA: Diagnosis not present

## 2015-07-03 DIAGNOSIS — Z1231 Encounter for screening mammogram for malignant neoplasm of breast: Secondary | ICD-10-CM | POA: Diagnosis not present

## 2015-07-03 DIAGNOSIS — Z124 Encounter for screening for malignant neoplasm of cervix: Secondary | ICD-10-CM | POA: Diagnosis not present

## 2015-07-03 DIAGNOSIS — R252 Cramp and spasm: Secondary | ICD-10-CM | POA: Diagnosis not present

## 2015-07-03 DIAGNOSIS — E782 Mixed hyperlipidemia: Secondary | ICD-10-CM | POA: Diagnosis not present

## 2015-07-03 DIAGNOSIS — Z0000 Encounter for general adult medical examination without abnormal findings: Secondary | ICD-10-CM | POA: Diagnosis not present

## 2015-10-12 DIAGNOSIS — E119 Type 2 diabetes mellitus without complications: Secondary | ICD-10-CM | POA: Diagnosis not present

## 2015-12-21 DIAGNOSIS — G629 Polyneuropathy, unspecified: Secondary | ICD-10-CM | POA: Diagnosis not present

## 2015-12-21 DIAGNOSIS — E119 Type 2 diabetes mellitus without complications: Secondary | ICD-10-CM | POA: Diagnosis not present

## 2015-12-21 DIAGNOSIS — K047 Periapical abscess without sinus: Secondary | ICD-10-CM | POA: Diagnosis not present

## 2015-12-21 DIAGNOSIS — Z23 Encounter for immunization: Secondary | ICD-10-CM | POA: Diagnosis not present

## 2016-04-11 DIAGNOSIS — J101 Influenza due to other identified influenza virus with other respiratory manifestations: Secondary | ICD-10-CM | POA: Diagnosis not present

## 2016-04-11 DIAGNOSIS — Z1211 Encounter for screening for malignant neoplasm of colon: Secondary | ICD-10-CM | POA: Diagnosis not present

## 2016-04-18 DIAGNOSIS — H5213 Myopia, bilateral: Secondary | ICD-10-CM | POA: Diagnosis not present

## 2016-04-18 DIAGNOSIS — E119 Type 2 diabetes mellitus without complications: Secondary | ICD-10-CM | POA: Diagnosis not present

## 2016-04-18 DIAGNOSIS — H40013 Open angle with borderline findings, low risk, bilateral: Secondary | ICD-10-CM | POA: Diagnosis not present

## 2016-05-09 DIAGNOSIS — H40013 Open angle with borderline findings, low risk, bilateral: Secondary | ICD-10-CM | POA: Diagnosis not present

## 2016-05-26 DIAGNOSIS — H40013 Open angle with borderline findings, low risk, bilateral: Secondary | ICD-10-CM | POA: Diagnosis not present

## 2016-06-06 DIAGNOSIS — E119 Type 2 diabetes mellitus without complications: Secondary | ICD-10-CM | POA: Diagnosis not present

## 2016-07-29 ENCOUNTER — Other Ambulatory Visit: Payer: Self-pay | Admitting: Internal Medicine

## 2016-07-29 DIAGNOSIS — Z1231 Encounter for screening mammogram for malignant neoplasm of breast: Secondary | ICD-10-CM

## 2016-07-31 DIAGNOSIS — M545 Low back pain: Secondary | ICD-10-CM | POA: Diagnosis not present

## 2016-07-31 DIAGNOSIS — R109 Unspecified abdominal pain: Secondary | ICD-10-CM | POA: Diagnosis not present

## 2016-08-05 ENCOUNTER — Ambulatory Visit
Admission: RE | Admit: 2016-08-05 | Discharge: 2016-08-05 | Disposition: A | Discharge status: Home / Self Care | Payer: BLUE CROSS/BLUE SHIELD | Source: Ambulatory Visit | Attending: Internal Medicine | Admitting: Internal Medicine

## 2016-08-05 DIAGNOSIS — Z1231 Encounter for screening mammogram for malignant neoplasm of breast: Secondary | ICD-10-CM

## 2016-08-08 DIAGNOSIS — E782 Mixed hyperlipidemia: Secondary | ICD-10-CM | POA: Diagnosis not present

## 2016-08-08 DIAGNOSIS — Z0000 Encounter for general adult medical examination without abnormal findings: Secondary | ICD-10-CM | POA: Diagnosis not present

## 2016-08-08 DIAGNOSIS — E119 Type 2 diabetes mellitus without complications: Secondary | ICD-10-CM | POA: Diagnosis not present

## 2016-08-08 DIAGNOSIS — I10 Essential (primary) hypertension: Secondary | ICD-10-CM | POA: Diagnosis not present

## 2016-08-08 DIAGNOSIS — Z124 Encounter for screening for malignant neoplasm of cervix: Secondary | ICD-10-CM | POA: Diagnosis not present

## 2016-08-22 DIAGNOSIS — E119 Type 2 diabetes mellitus without complications: Secondary | ICD-10-CM | POA: Diagnosis not present

## 2016-08-22 DIAGNOSIS — R51 Headache: Secondary | ICD-10-CM | POA: Diagnosis not present

## 2016-08-22 DIAGNOSIS — I10 Essential (primary) hypertension: Secondary | ICD-10-CM | POA: Diagnosis not present

## 2016-08-22 DIAGNOSIS — L989 Disorder of the skin and subcutaneous tissue, unspecified: Secondary | ICD-10-CM | POA: Diagnosis not present

## 2016-09-12 DIAGNOSIS — I10 Essential (primary) hypertension: Secondary | ICD-10-CM | POA: Diagnosis not present

## 2016-09-12 DIAGNOSIS — N3090 Cystitis, unspecified without hematuria: Secondary | ICD-10-CM | POA: Diagnosis not present

## 2016-09-26 DIAGNOSIS — M542 Cervicalgia: Secondary | ICD-10-CM | POA: Diagnosis not present

## 2016-09-26 DIAGNOSIS — G44209 Tension-type headache, unspecified, not intractable: Secondary | ICD-10-CM | POA: Diagnosis not present

## 2016-10-17 DIAGNOSIS — H40013 Open angle with borderline findings, low risk, bilateral: Secondary | ICD-10-CM | POA: Diagnosis not present

## 2016-12-05 DIAGNOSIS — M545 Low back pain: Secondary | ICD-10-CM | POA: Diagnosis not present

## 2016-12-12 DIAGNOSIS — M545 Low back pain: Secondary | ICD-10-CM | POA: Diagnosis not present

## 2016-12-19 DIAGNOSIS — M5127 Other intervertebral disc displacement, lumbosacral region: Secondary | ICD-10-CM | POA: Diagnosis not present

## 2016-12-19 DIAGNOSIS — M5416 Radiculopathy, lumbar region: Secondary | ICD-10-CM | POA: Diagnosis not present

## 2016-12-19 DIAGNOSIS — M545 Low back pain: Secondary | ICD-10-CM | POA: Diagnosis not present

## 2017-01-05 ENCOUNTER — Other Ambulatory Visit: Payer: Self-pay

## 2017-01-10 DIAGNOSIS — M5416 Radiculopathy, lumbar region: Secondary | ICD-10-CM | POA: Diagnosis not present

## 2017-01-10 DIAGNOSIS — M545 Low back pain: Secondary | ICD-10-CM | POA: Diagnosis not present

## 2017-01-10 DIAGNOSIS — M5127 Other intervertebral disc displacement, lumbosacral region: Secondary | ICD-10-CM | POA: Diagnosis not present

## 2017-01-13 DIAGNOSIS — R300 Dysuria: Secondary | ICD-10-CM | POA: Diagnosis not present

## 2017-01-13 DIAGNOSIS — Z23 Encounter for immunization: Secondary | ICD-10-CM | POA: Diagnosis not present

## 2017-01-15 DIAGNOSIS — H40013 Open angle with borderline findings, low risk, bilateral: Secondary | ICD-10-CM | POA: Diagnosis not present

## 2017-01-26 DIAGNOSIS — M5432 Sciatica, left side: Secondary | ICD-10-CM | POA: Diagnosis not present

## 2017-01-26 DIAGNOSIS — M545 Low back pain: Secondary | ICD-10-CM | POA: Diagnosis not present

## 2017-01-26 DIAGNOSIS — M5416 Radiculopathy, lumbar region: Secondary | ICD-10-CM | POA: Diagnosis not present

## 2017-01-26 DIAGNOSIS — M5127 Other intervertebral disc displacement, lumbosacral region: Secondary | ICD-10-CM | POA: Diagnosis not present

## 2017-01-27 DIAGNOSIS — R350 Frequency of micturition: Secondary | ICD-10-CM | POA: Diagnosis not present

## 2017-01-28 DIAGNOSIS — R350 Frequency of micturition: Secondary | ICD-10-CM | POA: Diagnosis not present

## 2017-02-06 DIAGNOSIS — E119 Type 2 diabetes mellitus without complications: Secondary | ICD-10-CM | POA: Diagnosis not present

## 2017-02-06 DIAGNOSIS — J069 Acute upper respiratory infection, unspecified: Secondary | ICD-10-CM | POA: Diagnosis not present

## 2017-02-13 DIAGNOSIS — E782 Mixed hyperlipidemia: Secondary | ICD-10-CM | POA: Diagnosis not present

## 2017-02-13 DIAGNOSIS — R300 Dysuria: Secondary | ICD-10-CM | POA: Diagnosis not present

## 2017-02-13 DIAGNOSIS — G629 Polyneuropathy, unspecified: Secondary | ICD-10-CM | POA: Diagnosis not present

## 2017-02-13 DIAGNOSIS — E119 Type 2 diabetes mellitus without complications: Secondary | ICD-10-CM | POA: Diagnosis not present

## 2017-02-13 DIAGNOSIS — I10 Essential (primary) hypertension: Secondary | ICD-10-CM | POA: Diagnosis not present

## 2017-02-14 DIAGNOSIS — M5416 Radiculopathy, lumbar region: Secondary | ICD-10-CM | POA: Diagnosis not present

## 2017-02-14 DIAGNOSIS — M545 Low back pain: Secondary | ICD-10-CM | POA: Diagnosis not present

## 2017-02-14 DIAGNOSIS — M5127 Other intervertebral disc displacement, lumbosacral region: Secondary | ICD-10-CM | POA: Diagnosis not present

## 2017-02-27 DIAGNOSIS — M545 Low back pain: Secondary | ICD-10-CM | POA: Diagnosis not present

## 2017-02-27 DIAGNOSIS — M5136 Other intervertebral disc degeneration, lumbar region: Secondary | ICD-10-CM | POA: Diagnosis not present

## 2017-02-27 DIAGNOSIS — M5432 Sciatica, left side: Secondary | ICD-10-CM | POA: Diagnosis not present

## 2017-03-05 NOTE — Progress Notes (Signed)
Need orders in epic for 1-9 surgery

## 2017-03-11 ENCOUNTER — Ambulatory Visit: Payer: Self-pay | Admitting: Orthopedic Surgery

## 2017-03-11 NOTE — H&P (Signed)
[name] is an 62 y.o. female.   Chief Complaint: back and leg pain HPI: The patient is a 62 year old female who presents today for follow up of their back. The patient is being followed for their back pain. They are now 6 month(s) out from flare up and 2 years and 11 1/2 months out from decompression. Symptoms reported today include: pain. The patient states that they are doing poorly. Current treatment includes: activity modification and pain medications (OTC). The following medication has been used for pain control: Tylenol. The patient reports their current pain level to be 8 / 10. The patient presents today following SNRB (left S1). The patient has not gotten any relief of their symptoms with Cortisone injections.  Past Medical History:  Diagnosis Date  . Arthritis    arhtritis back and hands  . Diabetes mellitus without complication   . GERD (gastroesophageal reflux disease)   . Hypertension   . Neuromuscular disorder    nerve pain down right leg    Past Surgical History:  Procedure Laterality Date  . BACK SURGERY    . FOOT SURGERY Left    bunion with retained pin  . LUMBAR LAMINECTOMY/DECOMPRESSION MICRODISCECTOMY Right 03/01/2014   Procedure: MICRO LUMBAR DECOMPRESSION L3 - L4 and L4 - L5 ON THE RIGHT  2 LEVELS;  Surgeon: Johnn Hai, MD;  Location: [facility]S;  Service: Orthopedics;  Laterality: Right;  . TONSILLECTOMY    . TUBAL LIGATION      No family history on file. Social History:  reports that  has never smoked. She does not have any smokeless tobacco history on file. She reports that she drinks alcohol. She reports that she does not use drugs.  Allergies: No Known Allergies   (Not in a hospital admission)  No results found for this or any previous visit (from the past 48 hour(s)). No results found.  Review of Systems  Constitutional: Negative.   HENT: Negative.   Eyes: Negative.   Respiratory: Negative.   Cardiovascular: Negative.    Gastrointestinal: Negative.   Genitourinary: Negative.   Musculoskeletal: Positive for back pain.  Skin: Negative.   Neurological: Positive for sensory change and focal weakness.  Psychiatric/Behavioral: Negative.     There were no vitals taken for this visit. Physical Exam  Constitutional: She is oriented to person, place, and time. She appears well-developed and well-nourished.  HENT:  Head: Normocephalic.  Eyes: Pupils are equal, round, and reactive to light.  Neck: Normal range of motion.  Cardiovascular: Normal rate.  Respiratory: Effort normal.  GI: Soft.  Musculoskeletal:  Moderate distress with an antalgic gait and affect appropriate straight leg raise left toes buttock pain calf pain negative on the right EHLs 4 5 left as is plantar flexion. Decreased Achilles reflex on the left decreased sensation S1 dermatome. Noticeability hips knees and ankles pelvis is stable sensory exam is slightly decreased in S1 dermatome. No Babinski or clonus abdomen soft nontender nontender thoracic spine tender lumbosacral junction cervical spine some discomfort in forward flexion and extension of the cervical spine but motor is 5/5 in the upper extremities to normal flexion sensory exams intact. No DVT.  Neurological: She is alert and oriented to person, place, and time.  Skin: Skin is warm and dry.    MRI demonstrates a recurrent disc herniation L5-S1 on the left compressing the S1 nerve root. There is associated disc degeneration. She is a disc protrusion L3-4 on the right she says her pain is mild  and intermittent on the right.  Assessment/Plan Patient is refractory S1 radiculopathy secondary recurrent disc herniation L5-S1 on the right with minimal back pain. She is fell conservative treatment including rest activity modification home therapy program and a selective nerve root block. Her L3-4 disc is better we discussed a revision microlumbar decompression on the left only unless her symptoms  recur on the right. Discussed the risks and benefits. Including longer recovery time increased risk for CSF leakage etc. And possible need for fusion in the future. Otherwise 5 healthy no DVT. Preoperative clearance. She changes in the interim she is to call  Plan revision microlumbar decompression L5-S1  Cecilie Kicks., PA-C for Dr. Tonita Cong 03/11/2017, 8:47 AM

## 2017-03-11 NOTE — H&P (View-Only) (Signed)
[name] is an 62 y.o. female.   Chief Complaint: back and leg pain HPI: The patient is a 62 year old female who presents today for follow up of their back. The patient is being followed for their back pain. They are now 6 month(s) out from flare up and 2 years and 11 1/2 months out from decompression. Symptoms reported today include: pain. The patient states that they are doing poorly. Current treatment includes: activity modification and pain medications (OTC). The following medication has been used for pain control: Tylenol. The patient reports their current pain level to be 8 / 10. The patient presents today following SNRB (left S1). The patient has not gotten any relief of their symptoms with Cortisone injections.  Past Medical History:  Diagnosis Date  . Arthritis    arhtritis back and hands  . Diabetes mellitus without complication   . GERD (gastroesophageal reflux disease)   . Hypertension   . Neuromuscular disorder    nerve pain down right leg    Past Surgical History:  Procedure Laterality Date  . BACK SURGERY    . FOOT SURGERY Left    bunion with retained pin  . LUMBAR LAMINECTOMY/DECOMPRESSION MICRODISCECTOMY Right 03/01/2014   Procedure: MICRO LUMBAR DECOMPRESSION L3 - L4 and L4 - L5 ON THE RIGHT  2 LEVELS;  Surgeon: Johnn Hai, MD;  Location: [facility];  Service: Orthopedics;  Laterality: Right;  . TONSILLECTOMY    . TUBAL LIGATION      No family history on file. Social History:  reports that  has never smoked. She does not have any smokeless tobacco history on file. She reports that she drinks alcohol. She reports that she does not use drugs.  Allergies: No Known Allergies   (Not in a hospital admission)  No results found for this or any previous visit (from the past 48 hour(s)). No results found.  Review of Systems  Constitutional: Negative.   HENT: Negative.   Eyes: Negative.   Respiratory: Negative.   Cardiovascular: Negative.    Gastrointestinal: Negative.   Genitourinary: Negative.   Musculoskeletal: Positive for back pain.  Skin: Negative.   Neurological: Positive for sensory change and focal weakness.  Psychiatric/Behavioral: Negative.     There were no vitals taken for this visit. Physical Exam  Constitutional: She is oriented to person, place, and time. She appears well-developed and well-nourished.  HENT:  Head: Normocephalic.  Eyes: Pupils are equal, round, and reactive to light.  Neck: Normal range of motion.  Cardiovascular: Normal rate.  Respiratory: Effort normal.  GI: Soft.  Musculoskeletal:  Moderate distress with an antalgic gait and affect appropriate straight leg raise left toes buttock pain calf pain negative on the right EHLs 4 5 left as is plantar flexion. Decreased Achilles reflex on the left decreased sensation S1 dermatome. Noticeability hips knees and ankles pelvis is stable sensory exam is slightly decreased in S1 dermatome. No Babinski or clonus abdomen soft nontender nontender thoracic spine tender lumbosacral junction cervical spine some discomfort in forward flexion and extension of the cervical spine but motor is 5/5 in the upper extremities to normal flexion sensory exams intact. No DVT.  Neurological: She is alert and oriented to person, place, and time.  Skin: Skin is warm and dry.    MRI demonstrates a recurrent disc herniation L5-S1 on the left compressing the S1 nerve root. There is associated disc degeneration. She is a disc protrusion L3-4 on the right she says her pain is mild  and intermittent on the right.  Assessment/Plan Patient is refractory S1 radiculopathy secondary recurrent disc herniation L5-S1 on the right with minimal back pain. She is fell conservative treatment including rest activity modification home therapy program and a selective nerve root block. Her L3-4 disc is better we discussed a revision microlumbar decompression on the left only unless her symptoms  recur on the right. Discussed the risks and benefits. Including longer recovery time increased risk for CSF leakage etc. And possible need for fusion in the future. Otherwise 5 healthy no DVT. Preoperative clearance. She changes in the interim she is to call  Plan revision microlumbar decompression L5-S1  Cecilie Kicks., PA-C for Dr. Tonita Cong 03/11/2017, 8:47 AM

## 2017-03-16 ENCOUNTER — Other Ambulatory Visit (HOSPITAL_COMMUNITY): Payer: Self-pay | Admitting: Emergency Medicine

## 2017-03-16 NOTE — Patient Instructions (Addendum)
[name]  03/16/2017   Your procedure is scheduled on: 03-18-17  Report to [facility] Main  Entrance    Report to admitting at [facility]   Call this number if you have problems the morning of surgery [phone]     Remember: Do not eat food or drink liquids :After Midnight.     Take these medicines the morning of surgery with A SIP OF WATER: gabapentin, lovastatin, ranitidine, tylenol if needed                                You may not have any metal on your body including hair pins and              piercings  Do not wear jewelry, make-up, lotions, powders or perfumes, deodorant             Do not wear nail polish.  Do not shave  48 hours prior to surgery.     Do not bring valuables to the hospital. [place].  Contacts, dentures or bridgework may not be worn into surgery.      Patients discharged the day of surgery will not be allowed to drive home.  Name and phone number of your driver:  Special Instructions: N/A              Please read over the following fact sheets you were given: _____________________________________________________________________             How to Manage Your Diabetes Before and After Surgery  Why is it important to control my blood sugar before and after surgery? . Improving blood sugar levels before and after surgery helps healing and can limit problems. . A way of improving blood sugar control is eating a healthy diet by: o  Eating less sugar and carbohydrates o  Increasing activity/exercise o  Talking with your doctor about reaching your blood sugar goals . High blood sugars (greater than 180 mg/dL) can raise your risk of infections and slow your recovery, so you will need to focus on controlling your diabetes during the weeks before surgery. . Make sure that the doctor who takes care of your diabetes knows about your planned surgery including the date and  location.  How do I manage my blood sugar before surgery? . Check your blood sugar at least 4 times a day, starting 2 days before surgery, to make sure that the level is not too high or low. o Check your blood sugar the morning of your surgery when you wake up and every 2 hours until you get to the Short Stay unit. . If your blood sugar is less than 70 mg/dL, you will need to treat for low blood sugar: o Do not take insulin. o Treat a low blood sugar (less than 70 mg/dL) with  cup of clear juice (cranberry or apple), 4 glucose tablets, OR glucose gel. o Recheck blood sugar in 15 minutes after treatment (to make sure it is greater than 70 mg/dL). If your blood sugar is not greater than 70 mg/dL on recheck, call [phone] for further instructions. . Report your blood sugar to the short stay nurse when you get to Short Stay.  . If you are admitted  to the hospital after surgery: o Your blood sugar will be checked by the staff and you will probably be given insulin after surgery (instead of oral diabetes medicines) to make sure you have good blood sugar levels. o The goal for blood sugar control after surgery is 80-180 mg/dL.   WHAT DO I DO ABOUT MY DIABETES MEDICATION?   THE DAY BEFORE SURGERY, take  [facility] as normal    THE MORNING OF SURGERY, Do not take any diabetes medication !   Patient Signature:  Date:   Nurse Signature:  Date:   Reviewed and Endorsed by [facility] Patient Education Committee, August 2015    [facility] - Preparing for Surgery Before surgery, you can play an important role.  Because skin is not sterile, your skin needs to be as free of germs as possible.  You can reduce the number of germs on your skin by washing with CHG (chlorahexidine gluconate) soap before surgery.  CHG is an antiseptic cleaner which kills germs and bonds with the skin to continue killing germs even after washing. Please DO NOT use if you have an allergy to CHG or antibacterial soaps.   If your skin becomes reddened/irritated stop using the CHG and inform your nurse when you arrive at Short Stay. Do not shave (including legs and underarms) for at least 48 hours prior to the first CHG shower.  You may shave your face/neck. Please follow these instructions carefully:  1.  Shower with CHG Soap the night before surgery and the  morning of Surgery.  2.  If you choose to wash your hair, wash your hair first as usual with your  normal  shampoo.  3.  After you shampoo, rinse your hair and body thoroughly to remove the  shampoo.                           4.  Use CHG as you would any other liquid soap.  You can apply chg directly  to the skin and wash                       Gently with a scrungie or clean washcloth.  5.  Apply the CHG Soap to your body ONLY FROM THE NECK DOWN.   Do not use on face/ open                           Wound or open sores. Avoid contact with eyes, ears mouth and genitals (private parts).                       Wash face,  Genitals (private parts) with your normal soap.             6.  Wash thoroughly, paying special attention to the area where your surgery  will be performed.  7.  Thoroughly rinse your body with warm water from the neck down.  8.  DO NOT shower/wash with your normal soap after using and rinsing off  the CHG Soap.                9.  Pat yourself dry with a clean towel.            10.  Wear clean pajamas.            11.  Place clean sheets on your bed the  night of your first shower and do not  sleep with pets. Day of Surgery : Do not apply any lotions/deodorants the morning of surgery.  Please wear clean clothes to the hospital/surgery center.  FAILURE TO FOLLOW THESE INSTRUCTIONS MAY RESULT IN THE CANCELLATION OF YOUR SURGERY PATIENT SIGNATURE_________________________________  NURSE SIGNATURE__________________________________  ________________________________________________________________________   [name]  An incentive  spirometer is a tool that can help keep your lungs clear and active. This tool measures how well you are filling your lungs with each breath. Taking long deep breaths may help reverse or decrease the chance of developing breathing (pulmonary) problems (especially infection) following:  A long period of time when you are unable to move or be active. BEFORE THE PROCEDURE   If the spirometer includes an indicator to show your best effort, your nurse or respiratory therapist will set it to a desired goal.  If possible, sit up straight or lean slightly forward. Try not to slouch.  Hold the incentive spirometer in an upright position. INSTRUCTIONS FOR USE  1. Sit on the edge of your bed if possible, or sit up as far as you can in bed or on a chair. 2. Hold the incentive spirometer in an upright position. 3. Breathe out normally. 4. Place the mouthpiece in your mouth and seal your lips tightly around it. 5. Breathe in slowly and as deeply as possible, raising the piston or the ball toward the top of the column. 6. Hold your breath for 3-5 seconds or for as long as possible. Allow the piston or ball to fall to the bottom of the column. 7. Remove the mouthpiece from your mouth and breathe out normally. 8. Rest for a few seconds and repeat Steps 1 through 7 at least 10 times every 1-2 hours when you are awake. Take your time and take a few normal breaths between deep breaths. 9. The spirometer may include an indicator to show your best effort. Use the indicator as a goal to work toward during each repetition. 10. After each set of 10 deep breaths, practice coughing to be sure your lungs are clear. If you have an incision (the cut made at the time of surgery), support your incision when coughing by placing a pillow or rolled up towels firmly against it. Once you are able to get out of bed, walk around indoors and cough well. You may stop using the incentive spirometer when instructed by your caregiver.   RISKS AND COMPLICATIONS  Take your time so you do not get dizzy or light-headed.  If you are in pain, you may need to take or ask for pain medication before doing incentive spirometry. It is harder to take a deep breath if you are having pain. AFTER USE  Rest and breathe slowly and easily.  It can be helpful to keep track of a log of your progress. Your caregiver can provide you with a simple table to help with this. If you are using the spirometer at home, follow these instructions: [place] IF:   You are having difficultly using the spirometer.  You have trouble using the spirometer as often as instructed.  Your pain medication is not giving enough relief while using the spirometer.  You develop fever of 100.5 F (38.1 C) or higher. SEEK IMMEDIATE MEDICAL CARE IF:   You cough up bloody sputum that had not been present before.  You develop fever of 102 F (38.9 C) or greater.  You develop worsening pain at or  near the incision site. MAKE SURE YOU:   Understand these instructions.  Will watch your condition.  Will get help right away if you are not doing well or get worse. Document Released: 07/07/2006 Document Revised: 05/19/2011 Document Reviewed: 09/07/2006 [facility] Patient Information 2014 [place], [place].   ________________________________________________________________________

## 2017-03-17 ENCOUNTER — Ambulatory Visit (HOSPITAL_COMMUNITY)
Admission: RE | Admit: 2017-03-17 | Discharge: 2017-03-17 | Disposition: A | Discharge status: Home / Self Care | Payer: BLUE CROSS/BLUE SHIELD | Source: Ambulatory Visit | Attending: Orthopedic Surgery | Admitting: Orthopedic Surgery

## 2017-03-17 ENCOUNTER — Ambulatory Visit: Payer: Self-pay | Admitting: Orthopedic Surgery

## 2017-03-17 ENCOUNTER — Encounter (HOSPITAL_COMMUNITY)
Admission: RE | Admit: 2017-03-17 | Discharge: 2017-03-17 | Disposition: A | Discharge status: Home / Self Care | Payer: BLUE CROSS/BLUE SHIELD | Source: Ambulatory Visit | Attending: Specialist | Admitting: Specialist

## 2017-03-17 ENCOUNTER — Other Ambulatory Visit: Payer: Self-pay

## 2017-03-17 ENCOUNTER — Encounter (HOSPITAL_COMMUNITY): Payer: Self-pay

## 2017-03-17 ENCOUNTER — Ambulatory Visit (HOSPITAL_COMMUNITY): Payer: BLUE CROSS/BLUE SHIELD

## 2017-03-17 ENCOUNTER — Encounter (INDEPENDENT_AMBULATORY_CARE_PROVIDER_SITE_OTHER): Payer: Self-pay

## 2017-03-17 DIAGNOSIS — Z01810 Encounter for preprocedural cardiovascular examination: Secondary | ICD-10-CM | POA: Insufficient documentation

## 2017-03-17 DIAGNOSIS — I10 Essential (primary) hypertension: Secondary | ICD-10-CM | POA: Insufficient documentation

## 2017-03-17 DIAGNOSIS — E119 Type 2 diabetes mellitus without complications: Secondary | ICD-10-CM | POA: Diagnosis not present

## 2017-03-17 DIAGNOSIS — M5126 Other intervertebral disc displacement, lumbar region: Secondary | ICD-10-CM | POA: Diagnosis not present

## 2017-03-17 DIAGNOSIS — R9431 Abnormal electrocardiogram [ECG] [EKG]: Secondary | ICD-10-CM | POA: Diagnosis not present

## 2017-03-17 DIAGNOSIS — R29890 Loss of height: Secondary | ICD-10-CM | POA: Diagnosis not present

## 2017-03-17 DIAGNOSIS — M545 Low back pain: Secondary | ICD-10-CM | POA: Diagnosis not present

## 2017-03-17 LAB — BASIC METABOLIC PANEL
ANION GAP: 9 (ref 5–15)
BUN: 18 mg/dL (ref 6–20)
CHLORIDE: 100 mmol/L — AB (ref 101–111)
CO2: 26 mmol/L (ref 22–32)
Calcium: 10.1 mg/dL (ref 8.9–10.3)
Creatinine, Ser: 0.93 mg/dL (ref 0.44–1.00)
GFR calc Af Amer: >60 mL/min (ref >60)
GFR calc non Af Amer: >60 mL/min (ref >60)
GLUCOSE: 137 mg/dL — AB (ref 65–99)
POTASSIUM: 4.3 mmol/L (ref 3.5–5.1)
Sodium: 135 mmol/L (ref 135–145)

## 2017-03-17 LAB — CBC
HEMATOCRIT: 43.2 % (ref 36.0–46.0)
HEMOGLOBIN: 14.1 g/dL (ref 12.0–15.0)
MCH: 30 pg (ref 26.0–34.0)
MCHC: 32.6 g/dL (ref 30.0–36.0)
MCV: 91.9 fL (ref 78.0–100.0)
Platelets: 331 10*3/uL (ref 150–400)
RBC: 4.7 MIL/uL (ref 3.87–5.11)
RDW: 13.1 % (ref 11.5–15.5)
WBC: 9.7 10*3/uL (ref 4.0–10.5)

## 2017-03-17 LAB — SURGICAL PCR SCREEN
MRSA, PCR: NEGATIVE
STAPHYLOCOCCUS AUREUS: NEGATIVE

## 2017-03-17 LAB — GLUCOSE, CAPILLARY: GLUCOSE-CAPILLARY: 174 mg/dL — AB (ref 65–99)

## 2017-03-17 LAB — HEMOGLOBIN A1C
HEMOGLOBIN A1C: 7.1 % — AB (ref 4.8–5.6)
Mean Plasma Glucose: 157.07 mg/dL

## 2017-03-18 ENCOUNTER — Encounter (HOSPITAL_COMMUNITY)
Admission: RE | Disposition: A | Discharge status: Home / Self Care | Payer: Self-pay | Source: Ambulatory Visit | Attending: Specialist

## 2017-03-18 ENCOUNTER — Ambulatory Visit (HOSPITAL_COMMUNITY): Payer: BLUE CROSS/BLUE SHIELD

## 2017-03-18 ENCOUNTER — Other Ambulatory Visit: Payer: Self-pay

## 2017-03-18 ENCOUNTER — Encounter (HOSPITAL_COMMUNITY): Payer: Self-pay | Admitting: Emergency Medicine

## 2017-03-18 ENCOUNTER — Ambulatory Visit (HOSPITAL_COMMUNITY)
Admission: RE | Admit: 2017-03-18 | Discharge: 2017-03-19 | Disposition: A | Discharge status: Home / Self Care | Payer: BLUE CROSS/BLUE SHIELD | Source: Ambulatory Visit | Attending: Specialist | Admitting: Specialist

## 2017-03-18 ENCOUNTER — Ambulatory Visit (HOSPITAL_COMMUNITY): Payer: BLUE CROSS/BLUE SHIELD | Admitting: Certified Registered Nurse Anesthetist

## 2017-03-18 DIAGNOSIS — G709 Myoneural disorder, unspecified: Secondary | ICD-10-CM | POA: Insufficient documentation

## 2017-03-18 DIAGNOSIS — Q7649 Other congenital malformations of spine, not associated with scoliosis: Secondary | ICD-10-CM | POA: Diagnosis not present

## 2017-03-18 DIAGNOSIS — M479 Spondylosis, unspecified: Secondary | ICD-10-CM | POA: Diagnosis not present

## 2017-03-18 DIAGNOSIS — M19041 Primary osteoarthritis, right hand: Secondary | ICD-10-CM | POA: Diagnosis not present

## 2017-03-18 DIAGNOSIS — M1990 Unspecified osteoarthritis, unspecified site: Secondary | ICD-10-CM | POA: Diagnosis not present

## 2017-03-18 DIAGNOSIS — K219 Gastro-esophageal reflux disease without esophagitis: Secondary | ICD-10-CM | POA: Insufficient documentation

## 2017-03-18 DIAGNOSIS — E119 Type 2 diabetes mellitus without complications: Secondary | ICD-10-CM | POA: Diagnosis not present

## 2017-03-18 DIAGNOSIS — M19042 Primary osteoarthritis, left hand: Secondary | ICD-10-CM | POA: Insufficient documentation

## 2017-03-18 DIAGNOSIS — I10 Essential (primary) hypertension: Secondary | ICD-10-CM | POA: Insufficient documentation

## 2017-03-18 DIAGNOSIS — M5127 Other intervertebral disc displacement, lumbosacral region: Secondary | ICD-10-CM | POA: Insufficient documentation

## 2017-03-18 DIAGNOSIS — M48061 Spinal stenosis, lumbar region without neurogenic claudication: Secondary | ICD-10-CM | POA: Diagnosis not present

## 2017-03-18 DIAGNOSIS — Z9889 Other specified postprocedural states: Secondary | ICD-10-CM | POA: Insufficient documentation

## 2017-03-18 DIAGNOSIS — M4807 Spinal stenosis, lumbosacral region: Secondary | ICD-10-CM | POA: Diagnosis not present

## 2017-03-18 DIAGNOSIS — Z419 Encounter for procedure for purposes other than remedying health state, unspecified: Secondary | ICD-10-CM

## 2017-03-18 DIAGNOSIS — M4326 Fusion of spine, lumbar region: Secondary | ICD-10-CM | POA: Diagnosis not present

## 2017-03-18 HISTORY — PX: LUMBAR LAMINECTOMY/DECOMPRESSION MICRODISCECTOMY: SHX5026

## 2017-03-18 LAB — GLUCOSE, CAPILLARY
GLUCOSE-CAPILLARY: 121 mg/dL — AB (ref 65–99)
Glucose-Capillary: 121 mg/dL — ABNORMAL HIGH (ref 65–99)
Glucose-Capillary: 203 mg/dL — ABNORMAL HIGH (ref 65–99)

## 2017-03-18 SURGERY — LUMBAR LAMINECTOMY/DECOMPRESSION MICRODISCECTOMY 1 LEVEL
Anesthesia: General | Laterality: Left

## 2017-03-18 MED ORDER — OXYCODONE HCL 5 MG PO TABS
10.0000 mg | ORAL_TABLET | ORAL | Status: DC | PRN
  Administered 2017-03-18 – 2017-03-19 (×4): 10 mg via ORAL
  Filled 2017-03-18 (×4): qty 2

## 2017-03-18 MED ORDER — ACETAMINOPHEN 650 MG RE SUPP: 650.0000 mg | RECTAL | Status: DC | PRN

## 2017-03-18 MED ORDER — ROCURONIUM BROMIDE 50 MG/5ML IV SOSY
PREFILLED_SYRINGE | INTRAVENOUS | Status: AC
  Filled 2017-03-18: qty 5

## 2017-03-18 MED ORDER — BUPIVACAINE-EPINEPHRINE 0.5% -1:200000 IJ SOLN
INTRAMUSCULAR | Status: DC | PRN
  Administered 2017-03-18: 10 mL

## 2017-03-18 MED ORDER — POLYETHYLENE GLYCOL 3350 17 G PO PACK: 17.0000 g | PACK | Freq: Every day | ORAL | Status: DC | PRN

## 2017-03-18 MED ORDER — LACTATED RINGERS IV SOLN
INTRAVENOUS | Status: DC
  Administered 2017-03-18 (×2): via INTRAVENOUS

## 2017-03-18 MED ORDER — BUPIVACAINE-EPINEPHRINE (PF) 0.5% -1:200000 IJ SOLN
INTRAMUSCULAR | Status: AC
  Filled 2017-03-18: qty 30

## 2017-03-18 MED ORDER — MIDAZOLAM HCL 2 MG/2ML IJ SOLN
INTRAMUSCULAR | Status: AC
  Filled 2017-03-18: qty 2

## 2017-03-18 MED ORDER — POTASSIUM CHLORIDE IN NACL 20-0.9 MEQ/L-% IV SOLN
INTRAVENOUS | Status: DC
  Administered 2017-03-18: 18:00:00 via INTRAVENOUS
  Filled 2017-03-18 (×2): qty 1000

## 2017-03-18 MED ORDER — CANAGLIFLOZIN 100 MG PO TABS: 300.0000 mg | ORAL_TABLET | Freq: Every day | ORAL | Status: DC

## 2017-03-18 MED ORDER — ACETAMINOPHEN 325 MG PO TABS: 650.0000 mg | ORAL_TABLET | ORAL | Status: DC | PRN

## 2017-03-18 MED ORDER — BISACODYL 5 MG PO TBEC: 5.0000 mg | DELAYED_RELEASE_TABLET | Freq: Every day | ORAL | Status: DC | PRN

## 2017-03-18 MED ORDER — FENTANYL CITRATE (PF) 100 MCG/2ML IJ SOLN
INTRAMUSCULAR | Status: DC | PRN
  Administered 2017-03-18 (×3): 50 ug via INTRAVENOUS

## 2017-03-18 MED ORDER — ENSURE ENLIVE PO LIQD
237.0000 mL | Freq: Two times a day (BID) | ORAL | Status: DC
  Administered 2017-03-19: 237 mL via ORAL

## 2017-03-18 MED ORDER — SUGAMMADEX SODIUM 200 MG/2ML IV SOLN
INTRAVENOUS | Status: AC
  Filled 2017-03-18: qty 2

## 2017-03-18 MED ORDER — PROMETHAZINE HCL 25 MG/ML IJ SOLN: 6.2500 mg | INTRAMUSCULAR | Status: DC | PRN

## 2017-03-18 MED ORDER — POLYMYXIN B SULFATE 500000 UNITS IJ SOLR
INTRAMUSCULAR | Status: DC | PRN
  Administered 2017-03-18: 500 mL

## 2017-03-18 MED ORDER — GELATIN ABSORBABLE MT POWD
OROMUCOSAL | Status: DC | PRN
  Administered 2017-03-18: 10 mL via TOPICAL

## 2017-03-18 MED ORDER — FENTANYL CITRATE (PF) 100 MCG/2ML IJ SOLN
INTRAMUSCULAR | Status: AC
  Filled 2017-03-18: qty 2

## 2017-03-18 MED ORDER — SUGAMMADEX SODIUM 200 MG/2ML IV SOLN
INTRAVENOUS | Status: DC | PRN
  Administered 2017-03-18: 200 mg via INTRAVENOUS

## 2017-03-18 MED ORDER — ALUM & MAG HYDROXIDE-SIMETH 200-200-20 MG/5ML PO SUSP: 30.0000 mL | Freq: Four times a day (QID) | ORAL | Status: DC | PRN

## 2017-03-18 MED ORDER — ONDANSETRON HCL 4 MG/2ML IJ SOLN: 4.0000 mg | Freq: Four times a day (QID) | INTRAMUSCULAR | Status: DC | PRN

## 2017-03-18 MED ORDER — MENTHOL 3 MG MT LOZG: 1.0000 | LOZENGE | OROMUCOSAL | Status: DC | PRN

## 2017-03-18 MED ORDER — HYDROMORPHONE HCL 1 MG/ML IJ SOLN: 1.0000 mg | INTRAMUSCULAR | Status: DC | PRN

## 2017-03-18 MED ORDER — ONDANSETRON HCL 4 MG PO TABS: 4.0000 mg | ORAL_TABLET | Freq: Four times a day (QID) | ORAL | Status: DC | PRN

## 2017-03-18 MED ORDER — HYDROMORPHONE HCL 1 MG/ML IJ SOLN
INTRAMUSCULAR | Status: AC
  Administered 2017-03-18: 0.5 mg via INTRAVENOUS
  Filled 2017-03-18: qty 1

## 2017-03-18 MED ORDER — PHENOL 1.4 % MT LIQD
1.0000 | OROMUCOSAL | Status: DC | PRN
  Filled 2017-03-18: qty 177

## 2017-03-18 MED ORDER — LATANOPROST 0.005 % OP SOLN
1.0000 [drp] | Freq: Every day | OPHTHALMIC | Status: DC
  Administered 2017-03-18: 1 [drp] via OPHTHALMIC
  Filled 2017-03-18: qty 2.5

## 2017-03-18 MED ORDER — CEFAZOLIN SODIUM-DEXTROSE 2-4 GM/100ML-% IV SOLN
2.0000 g | INTRAVENOUS | Status: AC
  Administered 2017-03-18: 2 g via INTRAVENOUS
  Filled 2017-03-18: qty 100

## 2017-03-18 MED ORDER — MAGNESIUM CITRATE PO SOLN: 1.0000 | Freq: Once | ORAL | Status: DC | PRN

## 2017-03-18 MED ORDER — OXYCODONE HCL 5 MG PO TABS
ORAL_TABLET | ORAL | Status: AC
  Filled 2017-03-18: qty 1

## 2017-03-18 MED ORDER — DEXAMETHASONE SODIUM PHOSPHATE 4 MG/ML IJ SOLN
INTRAMUSCULAR | Status: DC | PRN
  Administered 2017-03-18: 10 mg via INTRAVENOUS

## 2017-03-18 MED ORDER — POLYMYXIN B SULFATE 500000 UNITS IJ SOLR
INTRAMUSCULAR | Status: AC
  Filled 2017-03-18: qty 500000

## 2017-03-18 MED ORDER — HYDROMORPHONE HCL 1 MG/ML IJ SOLN
INTRAMUSCULAR | Status: AC
  Administered 2017-03-18: 0.25 mg via INTRAVENOUS
  Filled 2017-03-18: qty 1

## 2017-03-18 MED ORDER — HYDROMORPHONE HCL 1 MG/ML IJ SOLN: 0.2500 mg | INTRAMUSCULAR | Status: DC | PRN

## 2017-03-18 MED ORDER — LIDOCAINE 2% (20 MG/ML) 5 ML SYRINGE
INTRAMUSCULAR | Status: AC
  Filled 2017-03-18: qty 5

## 2017-03-18 MED ORDER — HYDROMORPHONE HCL 1 MG/ML IJ SOLN
0.2500 mg | INTRAMUSCULAR | Status: DC | PRN
  Administered 2017-03-18: 0.25 mg via INTRAVENOUS
  Administered 2017-03-18: 0.5 mg via INTRAVENOUS
  Administered 2017-03-18: 0.25 mg via INTRAVENOUS
  Administered 2017-03-18 (×2): 0.5 mg via INTRAVENOUS

## 2017-03-18 MED ORDER — LACTATED RINGERS IV SOLN: INTRAVENOUS | Status: DC | PRN

## 2017-03-18 MED ORDER — PHENYLEPHRINE 40 MCG/ML (10ML) SYRINGE FOR IV PUSH (FOR BLOOD PRESSURE SUPPORT)
PREFILLED_SYRINGE | INTRAVENOUS | Status: DC | PRN
  Administered 2017-03-18 (×7): 80 ug via INTRAVENOUS

## 2017-03-18 MED ORDER — OXYCODONE HCL 5 MG PO TABS
5.0000 mg | ORAL_TABLET | ORAL | Status: DC | PRN
  Administered 2017-03-18: 5 mg via ORAL

## 2017-03-18 MED ORDER — ROCURONIUM BROMIDE 10 MG/ML (PF) SYRINGE
PREFILLED_SYRINGE | INTRAVENOUS | Status: DC | PRN
  Administered 2017-03-18: 10 mg via INTRAVENOUS
  Administered 2017-03-18: 40 mg via INTRAVENOUS

## 2017-03-18 MED ORDER — BENAZEPRIL HCL 10 MG PO TABS
10.0000 mg | ORAL_TABLET | Freq: Every day | ORAL | Status: DC
  Administered 2017-03-19: 10 mg via ORAL
  Filled 2017-03-18: qty 1

## 2017-03-18 MED ORDER — CYCLOBENZAPRINE HCL 10 MG PO TABS
10.0000 mg | ORAL_TABLET | Freq: Every day | ORAL | Status: DC
  Administered 2017-03-18: 10 mg via ORAL
  Filled 2017-03-18: qty 1

## 2017-03-18 MED ORDER — METHOCARBAMOL 1000 MG/10ML IJ SOLN
500.0000 mg | Freq: Four times a day (QID) | INTRAVENOUS | Status: DC | PRN
  Administered 2017-03-18: 500 mg via INTRAVENOUS
  Filled 2017-03-18: qty 550

## 2017-03-18 MED ORDER — ONDANSETRON HCL 4 MG/2ML IJ SOLN
INTRAMUSCULAR | Status: DC | PRN
  Administered 2017-03-18: 4 mg via INTRAVENOUS

## 2017-03-18 MED ORDER — FAMOTIDINE 20 MG PO TABS
20.0000 mg | ORAL_TABLET | Freq: Two times a day (BID) | ORAL | Status: DC
  Administered 2017-03-18 – 2017-03-19 (×2): 20 mg via ORAL
  Filled 2017-03-18 (×2): qty 1

## 2017-03-18 MED ORDER — METHOCARBAMOL 500 MG PO TABS
500.0000 mg | ORAL_TABLET | Freq: Four times a day (QID) | ORAL | Status: DC | PRN
  Administered 2017-03-19: 500 mg via ORAL
  Filled 2017-03-18: qty 1

## 2017-03-18 MED ORDER — CEFAZOLIN SODIUM-DEXTROSE 2-4 GM/100ML-% IV SOLN
2.0000 g | Freq: Three times a day (TID) | INTRAVENOUS | Status: AC
  Administered 2017-03-18 – 2017-03-19 (×3): 2 g via INTRAVENOUS
  Filled 2017-03-18 (×3): qty 100

## 2017-03-18 MED ORDER — EPHEDRINE SULFATE-NACL 50-0.9 MG/10ML-% IV SOSY
PREFILLED_SYRINGE | INTRAVENOUS | Status: DC | PRN
  Administered 2017-03-18 (×2): 5 mg via INTRAVENOUS

## 2017-03-18 MED ORDER — DOCUSATE SODIUM 100 MG PO CAPS
100.0000 mg | ORAL_CAPSULE | Freq: Two times a day (BID) | ORAL | Status: DC
  Administered 2017-03-18 – 2017-03-19 (×2): 100 mg via ORAL
  Filled 2017-03-18 (×2): qty 1

## 2017-03-18 MED ORDER — DEXAMETHASONE SODIUM PHOSPHATE 10 MG/ML IJ SOLN
INTRAMUSCULAR | Status: AC
  Filled 2017-03-18: qty 1

## 2017-03-18 MED ORDER — BENAZEPRIL-HYDROCHLOROTHIAZIDE 10-12.5 MG PO TABS: 1.0000 | ORAL_TABLET | Freq: Every morning | ORAL | Status: DC

## 2017-03-18 MED ORDER — ONDANSETRON HCL 4 MG/2ML IJ SOLN
INTRAMUSCULAR | Status: AC
  Filled 2017-03-18: qty 2

## 2017-03-18 MED ORDER — THROMBIN (RECOMBINANT) 5000 UNITS EX SOLR
CUTANEOUS | Status: AC
  Filled 2017-03-18: qty 10000

## 2017-03-18 MED ORDER — MIDAZOLAM HCL 5 MG/5ML IJ SOLN
INTRAMUSCULAR | Status: DC | PRN
  Administered 2017-03-18: 2 mg via INTRAVENOUS

## 2017-03-18 MED ORDER — GABAPENTIN 300 MG PO CAPS
600.0000 mg | ORAL_CAPSULE | Freq: Two times a day (BID) | ORAL | Status: DC
  Administered 2017-03-18 – 2017-03-19 (×2): 600 mg via ORAL
  Filled 2017-03-18 (×2): qty 2

## 2017-03-18 MED ORDER — HYDROCHLOROTHIAZIDE 12.5 MG PO CAPS
12.5000 mg | ORAL_CAPSULE | Freq: Every day | ORAL | Status: DC
  Administered 2017-03-19: 12.5 mg via ORAL
  Filled 2017-03-18: qty 1

## 2017-03-18 MED ORDER — LIDOCAINE 2% (20 MG/ML) 5 ML SYRINGE
INTRAMUSCULAR | Status: DC | PRN
  Administered 2017-03-18: 60 mg via INTRAVENOUS

## 2017-03-18 MED ORDER — EPHEDRINE 5 MG/ML INJ
INTRAVENOUS | Status: AC
  Filled 2017-03-18: qty 10

## 2017-03-18 MED ORDER — PROPOFOL 10 MG/ML IV BOLUS
INTRAVENOUS | Status: DC | PRN
  Administered 2017-03-18: 150 mg via INTRAVENOUS

## 2017-03-18 MED ORDER — INSULIN ASPART 100 UNIT/ML ~~LOC~~ SOLN: 0.0000 [IU] | Freq: Three times a day (TID) | SUBCUTANEOUS | Status: DC

## 2017-03-18 MED ORDER — PHENYLEPHRINE 40 MCG/ML (10ML) SYRINGE FOR IV PUSH (FOR BLOOD PRESSURE SUPPORT)
PREFILLED_SYRINGE | INTRAVENOUS | Status: AC
  Filled 2017-03-18: qty 10

## 2017-03-18 SURGICAL SUPPLY — 50 items
BAG ZIPLOCK 12X15 (MISCELLANEOUS) IMPLANT
CLEANER TIP ELECTROSURG 2X2 (MISCELLANEOUS) ×2 IMPLANT
CLOSURE STERI-STRIP 1/4X4 (GAUZE/BANDAGES/DRESSINGS) ×2 IMPLANT
CLOTH 2% CHLOROHEXIDINE 3PK (PERSONAL CARE ITEMS) ×2 IMPLANT
COVER SURGICAL LIGHT HANDLE (MISCELLANEOUS) ×2 IMPLANT
DRAPE MICROSCOPE LEICA (MISCELLANEOUS) ×2 IMPLANT
DRAPE POUCH INSTRU U-SHP 10X18 (DRAPES) ×2 IMPLANT
DRAPE SHEET LG 3/4 BI-LAMINATE (DRAPES) ×2 IMPLANT
DRAPE SURG 17X11 SM STRL (DRAPES) ×2 IMPLANT
DRAPE UTILITY XL STRL (DRAPES) ×2 IMPLANT
DRSG AQUACEL AG ADV 3.5X 4 (GAUZE/BANDAGES/DRESSINGS) ×2 IMPLANT
DRSG AQUACEL AG ADV 3.5X 6 (GAUZE/BANDAGES/DRESSINGS) IMPLANT
DURAPREP 26ML APPLICATOR (WOUND CARE) ×2 IMPLANT
DURASEAL SPINE SEALANT 3ML (MISCELLANEOUS) IMPLANT
ELECT BLADE TIP CTD 4 INCH (ELECTRODE) IMPLANT
ELECT REM PT RETURN 15FT ADLT (MISCELLANEOUS) ×2 IMPLANT
GLOVE BIOGEL PI IND STRL 7.0 (GLOVE) ×3 IMPLANT
GLOVE BIOGEL PI INDICATOR 7.0 (GLOVE) ×3
GLOVE SURG SS PI 7.0 STRL IVOR (GLOVE) ×2 IMPLANT
GLOVE SURG SS PI 7.5 STRL IVOR (GLOVE) ×6 IMPLANT
GLOVE SURG SS PI 8.0 STRL IVOR (GLOVE) ×4 IMPLANT
GOWN STRL REUS W/ TWL LRG LVL3 (GOWN DISPOSABLE) ×2 IMPLANT
GOWN STRL REUS W/TWL LRG LVL3 (GOWN DISPOSABLE) ×2
GOWN STRL REUS W/TWL XL LVL3 (GOWN DISPOSABLE) ×4 IMPLANT
HEMOSTAT SPONGE AVITENE ULTRA (HEMOSTASIS) IMPLANT
IV CATH 14GX2 1/4 (CATHETERS) ×2 IMPLANT
KIT BASIN OR (CUSTOM PROCEDURE TRAY) ×2 IMPLANT
KIT POSITIONING SURG ANDREWS (MISCELLANEOUS) ×2 IMPLANT
MANIFOLD NEPTUNE II (INSTRUMENTS) ×2 IMPLANT
NEEDLE SPNL 18GX3.5 QUINCKE PK (NEEDLE) ×4 IMPLANT
PACK LAMINECTOMY ORTHO (CUSTOM PROCEDURE TRAY) ×2 IMPLANT
PATTIES SURGICAL .5 X.5 (GAUZE/BANDAGES/DRESSINGS) IMPLANT
PATTIES SURGICAL .75X.75 (GAUZE/BANDAGES/DRESSINGS) IMPLANT
PATTIES SURGICAL 1X1 (DISPOSABLE) IMPLANT
RUBBERBAND STERILE (MISCELLANEOUS) ×4 IMPLANT
SPONGE SURGIFOAM ABS GEL 100 (HEMOSTASIS) ×2 IMPLANT
STAPLER VISISTAT (STAPLE) IMPLANT
STRIP CLOSURE SKIN 1/2X4 (GAUZE/BANDAGES/DRESSINGS) ×2 IMPLANT
SUT NURALON 4 0 TR CR/8 (SUTURE) IMPLANT
SUT PROLENE 3 0 PS 2 (SUTURE) ×2 IMPLANT
SUT VIC AB 1 CT1 27 (SUTURE)
SUT VIC AB 1 CT1 27XBRD ANTBC (SUTURE) IMPLANT
SUT VIC AB 1-0 CT2 27 (SUTURE) ×4 IMPLANT
SUT VIC AB 2-0 CT1 27 (SUTURE)
SUT VIC AB 2-0 CT1 TAPERPNT 27 (SUTURE) IMPLANT
SUT VIC AB 2-0 CT2 27 (SUTURE) ×2 IMPLANT
SYR 3ML LL SCALE MARK (SYRINGE) IMPLANT
TOWEL OR 17X26 10 PK STRL BLUE (TOWEL DISPOSABLE) ×2 IMPLANT
TOWEL OR NON WOVEN STRL DISP B (DISPOSABLE) IMPLANT
YANKAUER SUCT BULB TIP NO VENT (SUCTIONS) IMPLANT

## 2017-03-18 NOTE — Interval H&P Note (Signed)
History and Physical Interval Note:  03/18/2017 11:02 AM  [name]  has presented today for surgery, with the diagnosis of HNP Reccurrent L5-S1 Left  The various methods of treatment have been discussed with the patient and family. After consideration of risks, benefits and other options for treatment, the patient has consented to  Procedure(s) with comments: Revision Microlumbar decompression L5-S1 left (Left) - 120 mins as a surgical intervention .  The patient's history has been reviewed, patient examined, no change in status, stable for surgery.  I have reviewed the patient's chart and labs.  Questions were answered to the patient's satisfaction.     Decorian Schuenemann C

## 2017-03-18 NOTE — Anesthesia Postprocedure Evaluation (Signed)
Anesthesia Post Note  Patient: [name]  Procedure(s) Performed: Revision Microlumbar decompression L5-S1 left (Left )     Patient location during evaluation: PACU Anesthesia Type: General Level of consciousness: awake and alert Pain management: pain level controlled Vital Signs Assessment: post-procedure vital signs reviewed and stable Respiratory status: spontaneous breathing, nonlabored ventilation, respiratory function stable and patient connected to nasal cannula oxygen Cardiovascular status: blood pressure returned to baseline and stable Postop Assessment: no apparent nausea or vomiting Anesthetic complications: no    Last Vitals:  Vitals:   03/18/17 1530 03/18/17 1630  BP: 129/78 116/73  Pulse: 93 91  Resp: 12 13  Temp: (!) 36.3 C (!) 36.3 C  SpO2: 100% 100%    Last Pain:  Vitals:   03/18/17 1630  TempSrc:   PainSc: 3                  Tiajuana Amass

## 2017-03-18 NOTE — Anesthesia Procedure Notes (Signed)
Procedure Name: Intubation Date/Time: 03/18/2017 11:51 AM Performed by: Claudia Desanctis, CRNA Pre-anesthesia Checklist: Patient identified, Emergency Drugs available, Suction available and Patient being monitored Patient Re-evaluated:Patient Re-evaluated prior to induction Oxygen Delivery Method: Circle system utilized Preoxygenation: Pre-oxygenation with 100% oxygen Induction Type: IV induction Ventilation: Mask ventilation without difficulty Laryngoscope Size: 2 and Miller Grade View: Grade I Tube type: Oral Tube size: 7.0 mm Number of attempts: 1 Airway Equipment and Method: Stylet Placement Confirmation: ETT inserted through vocal cords under direct vision,  positive ETCO2 and breath sounds checked- equal and bilateral Secured at: 21 cm Tube secured with: Tape Dental Injury: Teeth and Oropharynx as per pre-operative assessment

## 2017-03-18 NOTE — Anesthesia Preprocedure Evaluation (Addendum)
Anesthesia Evaluation  Patient identified by MRN, date of birth, ID band Patient awake    Reviewed: Allergy & Precautions, NPO status , Patient's Chart, lab work & pertinent test results  Airway Mallampati: II  TM Distance: >3 FB Neck ROM: Full    Dental  (+) Dental Advisory Given, Partial Lower, Partial Upper, Loose   Pulmonary neg pulmonary ROS,    Pulmonary exam normal        Cardiovascular hypertension, Pt. on medications  Rhythm:Regular Rate:Normal     Neuro/Psych  Neuromuscular disease    GI/Hepatic Neg liver ROS, GERD  Medicated,  Endo/Other  diabetes, Type 2  Renal/GU negative Renal ROS     Musculoskeletal  (+) Arthritis ,   Abdominal   Peds  Hematology negative hematology ROS (+)   Anesthesia Other Findings   Reproductive/Obstetrics                            Lab Results  Component Value Date   WBC 9.7 03/17/2017   HGB 14.1 03/17/2017   HCT 43.2 03/17/2017   MCV 91.9 03/17/2017   PLT 331 03/17/2017   Lab Results  Component Value Date   CREATININE 0.93 03/17/2017   BUN 18 03/17/2017   NA 135 03/17/2017   K 4.3 03/17/2017   CL 100 (L) 03/17/2017   CO2 26 03/17/2017    Anesthesia Physical Anesthesia Plan  ASA: II  Anesthesia Plan: General   Post-op Pain Management:    Induction: Intravenous  PONV Risk Score and Plan: 3 and Ondansetron, Dexamethasone and Treatment may vary due to age or medical condition  Airway Management Planned: Oral ETT  Additional Equipment:   Intra-op Plan:   Post-operative Plan: Extubation in OR  Informed Consent: I have reviewed the patients History and Physical, chart, labs and discussed the procedure including the risks, benefits and alternatives for the proposed anesthesia with the patient or authorized representative who has indicated his/her understanding and acceptance.   Dental advisory given  Plan Discussed with:  CRNA  Anesthesia Plan Comments:         Anesthesia Quick Evaluation

## 2017-03-18 NOTE — Transfer of Care (Signed)
Immediate Anesthesia Transfer of Care Note  Patient: [name]  Procedure(s) Performed: Revision Microlumbar decompression L5-S1 left (Left )  Patient Location: PACU  Anesthesia Type:General  Level of Consciousness: drowsy and patient cooperative  Airway & Oxygen Therapy: Patient Spontanous Breathing and Patient connected to face mask  Post-op Assessment: Report given to RN and Post -op Vital signs reviewed and stable  Post vital signs: Reviewed and stable  Last Vitals:  Vitals:   03/18/17 0908  BP: (!) 152/75  Pulse: 92  Resp: 18  Temp: 37.3 C  SpO2: 98%    Last Pain:  Vitals:   03/18/17 0924  TempSrc:   PainSc: 3       Patients Stated Pain Goal: 4 (70/76/15 1834)  Complications: No apparent anesthesia complications

## 2017-03-18 NOTE — Discharge Instructions (Signed)

## 2017-03-18 NOTE — Brief Op Note (Signed)
03/18/2017  1:53 PM  PATIENT:  [name]  62 y.o. female  PRE-OPERATIVE DIAGNOSIS:  HNP Reccurrent L5-S1 Left  POST-OPERATIVE DIAGNOSIS:  HNP Reccurrent L5-S1 Left  PROCEDURE:  Procedure(s) with comments: Revision Microlumbar decompression L5-S1 left (Left) - 120 mins  SURGEON:  Surgeon(s) and Role:    Susa Day, MD - Primary  PHYSICIAN ASSISTANT:   ASSISTANTS: [place]   ANESTHESIA:   general  EBL:  10 mL   BLOOD ADMINISTERED:none  DRAINS: none   LOCAL MEDICATIONS USED:  MARCAINE     SPECIMEN:  No Specimen  DISPOSITION OF SPECIMEN:  N/A  COUNTS:  YES  TOURNIQUET:  * No tourniquets in log *  DICTATION: .Other Dictation: Dictation Number [id number]  PLAN OF CARE: Admit for overnight observation  PATIENT DISPOSITION:  PACU - hemodynamically stable.   Delay start of Pharmacological VTE agent (>24hrs) due to surgical blood loss or risk of bleeding: yes

## 2017-03-19 ENCOUNTER — Encounter (HOSPITAL_COMMUNITY): Payer: Self-pay | Admitting: Specialist

## 2017-03-19 DIAGNOSIS — M19041 Primary osteoarthritis, right hand: Secondary | ICD-10-CM | POA: Diagnosis not present

## 2017-03-19 DIAGNOSIS — M5127 Other intervertebral disc displacement, lumbosacral region: Secondary | ICD-10-CM | POA: Diagnosis not present

## 2017-03-19 DIAGNOSIS — Z9889 Other specified postprocedural states: Secondary | ICD-10-CM | POA: Diagnosis not present

## 2017-03-19 DIAGNOSIS — E119 Type 2 diabetes mellitus without complications: Secondary | ICD-10-CM | POA: Diagnosis not present

## 2017-03-19 DIAGNOSIS — I10 Essential (primary) hypertension: Secondary | ICD-10-CM | POA: Diagnosis not present

## 2017-03-19 DIAGNOSIS — M479 Spondylosis, unspecified: Secondary | ICD-10-CM | POA: Diagnosis not present

## 2017-03-19 DIAGNOSIS — G709 Myoneural disorder, unspecified: Secondary | ICD-10-CM | POA: Diagnosis not present

## 2017-03-19 DIAGNOSIS — M48061 Spinal stenosis, lumbar region without neurogenic claudication: Secondary | ICD-10-CM | POA: Diagnosis not present

## 2017-03-19 DIAGNOSIS — M19042 Primary osteoarthritis, left hand: Secondary | ICD-10-CM | POA: Diagnosis not present

## 2017-03-19 DIAGNOSIS — K219 Gastro-esophageal reflux disease without esophagitis: Secondary | ICD-10-CM | POA: Diagnosis not present

## 2017-03-19 LAB — GLUCOSE, CAPILLARY: GLUCOSE-CAPILLARY: 135 mg/dL — AB (ref 65–99)

## 2017-03-19 LAB — BASIC METABOLIC PANEL
Anion gap: 8 (ref 5–15)
BUN: 18 mg/dL (ref 6–20)
CALCIUM: 9.5 mg/dL (ref 8.9–10.3)
CHLORIDE: 104 mmol/L (ref 101–111)
CO2: 25 mmol/L (ref 22–32)
CREATININE: 0.81 mg/dL (ref 0.44–1.00)
Glucose, Bld: 179 mg/dL — ABNORMAL HIGH (ref 65–99)
Potassium: 4.1 mmol/L (ref 3.5–5.1)
SODIUM: 137 mmol/L (ref 135–145)

## 2017-03-19 MED ORDER — DOCUSATE SODIUM 100 MG PO CAPS: 100.0000 mg | ORAL_CAPSULE | Freq: Two times a day (BID) | ORAL | 1 refills | Status: DC | PRN

## 2017-03-19 MED ORDER — METHOCARBAMOL 500 MG PO TABS: 500.0000 mg | ORAL_TABLET | Freq: Four times a day (QID) | ORAL | 2 refills | Status: DC | PRN

## 2017-03-19 MED ORDER — POLYETHYLENE GLYCOL 3350 17 G PO PACK: 17.0000 g | PACK | Freq: Every day | ORAL | 0 refills | Status: DC | PRN

## 2017-03-19 MED ORDER — OXYCODONE-ACETAMINOPHEN 5-325 MG PO TABS: 1.0000 | ORAL_TABLET | ORAL | 0 refills | Status: DC | PRN

## 2017-03-19 NOTE — Evaluation (Signed)
Occupational Therapy Evaluation Patient Details Name: [name] MRN: [id number] DOB: 06-07-1955 Today's Date: 03/19/2017    History of Present Illness s/p revision L5-S1   Clinical Impression   This 62 year old female was admitted for the above sx. All education was completed. No further OT is needed at this time    Follow Up Recommendations  No OT follow up;Supervision/Assistance - 24 hour    Equipment Recommendations  None recommended by OT    Recommendations for Other Services       Precautions / Restrictions Precautions Precautions: Back;Fall Restrictions Weight Bearing Restrictions: No      Mobility Bed Mobility Overal bed mobility: (supervision)             General bed mobility comments: cued to get back to bed via sidelying  Transfers Overall transfer level: Needs assistance Equipment used: None Transfers: Sit to/from Stand Sit to Stand: Supervision         General transfer comment: cues for back precautions    Balance                                           ADL either performed or assessed with clinical judgement   ADL Overall ADL's : Needs assistance/impaired                         Toilet Transfer: Min guard;Ambulation;Comfort height toilet;Grab bars             General ADL Comments: ambulated to bathroom, stopping by window seat to choose clothing. Pt was able to get dressed wtih set up/supervision, crossing legs without pain or pulling.  Re educated on sidestepping over tub. Brushed teeth with supervision standing also. Reviewed back precautions     Vision         Perception     Praxis      Pertinent Vitals/Pain Pain Assessment: Faces Faces Pain Scale: Hurts little more Pain Location: back Pain Descriptors / Indicators: Sore;Tightness Pain Intervention(s): Limited activity within patient's tolerance;Monitored during session;Premedicated before session;Repositioned     Hand  Dominance     Extremity/Trunk Assessment Upper Extremity Assessment Upper Extremity Assessment: Overall WFL for tasks assessed           Communication Communication Communication: No difficulties   Cognition Arousal/Alertness: Awake/alert Behavior During Therapy: WFL for tasks assessed/performed Overall Cognitive Status: Within Functional Limits for tasks assessed                                     General Comments       Exercises     Shoulder Instructions      Home Living Family/patient expects to be discharged to:: Private residence                   Bathroom Shower/Tub: Teaching laboratory technician Toilet: Standard     Home Equipment: None          Prior Functioning/Environment Level of Independence: Independent                 OT Problem List:        OT Treatment/Interventions:      OT Goals(Current goals can be found in the care plan section) Acute Rehab OT Goals Patient Stated  Goal: return to independence OT Goal Formulation: All assessment and education complete, DC therapy  OT Frequency:     Barriers to D/C:            Co-evaluation              AM-PAC PT "6 Clicks" Daily Activity     Outcome Measure Help from another person eating meals?: None Help from another person taking care of personal grooming?: A Little Help from another person toileting, which includes using toliet, bedpan, or urinal?: A Little Help from another person bathing (including washing, rinsing, drying)?: A Little Help from another person to put on and taking off regular upper body clothing?: A Little Help from another person to put on and taking off regular lower body clothing?: A Little 6 Click Score: 19   End of Session    Activity Tolerance: Patient tolerated treatment well Patient left: in bed;with call bell/phone within reach;with family/visitor present  OT Visit Diagnosis: Other abnormalities of gait and mobility (R26.89)                 Time: 3354-5625 OT Time Calculation (min): 22 min Charges:  OT General Charges $OT Visit: 1 Visit OT Evaluation $OT Eval Low Complexity: 1 Low G-Codes:     [place], OTR/L [phone] 03/19/2017  [name] 03/19/2017, 10:19 AM

## 2017-03-19 NOTE — Op Note (Signed)
NAME:  [name], [name]             ACCOUNT NO.:  [id number]  MEDICAL RECORD NO.:  [id number]  LOCATION:                                 FACILITY:  PHYSICIAN:  Susa Day, M.D.         DATE OF BIRTH:  DATE OF PROCEDURE:  03/18/2017 DATE OF DISCHARGE:                              OPERATIVE REPORT   PREOPERATIVE DIAGNOSES:  Recurrent disk herniation, spinal stenosis, L5- S1.  POSTOPERATIVE DIAGNOSES:  Recurrent disk herniation, spinal stenosis, L5- S1.  PROCEDURE PERFORMED: 1. Revision microlumbar decompression, L5-S1, left. 2. Foraminotomies, L5-S1, left. 3. Microdiskectomy, L5-S1, left.  ANESTHESIA:  General.  ASSISTANT:  Cleophas Dunker, PA.  HISTORY:  A 62 year old with history of lumbar decompression, recurrent disk protrusion, S1 radiculopathy refractory to conservative treatment. She had mainly left-sided symptoms, but also had some disk protrusion in the past at L3-4.  Preoperatively, she had a negative straight leg raise on the right, positive on the left.  EHL and plantar flexion weakness on the left.  Discussed revision microlumbar decompression on the left. Risks and benefits discussed including bleeding, infection, damage to neurovascular structures, no change in symptoms, worsening symptoms, DVT, PE, anesthetic complications, need for fusion in the future, etc. She had underlying of facet tropism there and a mild scoliosis.  TECHNIQUE:  With the patient in supine position, after induction of adequate general anesthesia, 2 g Kefzol, placed prone on the [place] frame.  All bony prominences were well padded.  Lumbar region was prepped and draped in usual sterile fashion.  Two 18-gauge spinal needles were utilized to localize the L5-S1 interspace confirmed with x- ray.  Incision was made from the spinous process at L5-S1.  Subcutaneous tissue was dissected.  Electrocautery was utilized to achieve hemostasis.  0.25% Marcaine with epinephrine was infiltrated in  the paraspinous musculature.  Dorsolumbar fascia was divided in line with skin incision.  Paraspinous muscle elevated from lamina of L5-S1.  Did an increased lumbosacral angle with shingling of the L5 lamina.  The initial spotting was at L4-L5.  Redirected the retractor to L5-S1, which was more proximal due to the increased lumbosacral angle in the shingling noted.  I used a curette to skeletonize the apparent previous hemilaminotomy.  There was extensive scar tissue noted here and inferiorly.  The patient had here fairly hard bone.  We performed hemilaminotomies of the caudad edge of L5.  We meticulously and gently developed the plane between the extensive scar tissue and the previous laminotomy.  I first went above the S1 nerve root.  I performed the hemilaminotomy.  Developed a plane between the previous laminotomy and the epidural fibrosis tear.  Gently mobilizing that medially.  Performed a foraminotomy of L5.  I was able identify the L5 root exiting the foramen.  Initially, with the radiographs, there was 1 [place]field 4 above and below the L5 nerve root.  The disk was more distally.  Again, the shingling of the sacrum and of the L5 lamina.  The disk space was somewhat elusive.  Disk space was less visible.  Again, with meticulous developing a plane between the epidural fibrosis and the S1 nerve root laterally, we were able to mobilize the  S1 nerve root.  There was extensive scarring into the lateral recess and extensive epidural venous plexus that was lysed and cauterized.  Protecting the S1 nerve root at all times.  With the [facility] retractor and large lamina in the previous foraminotomy of S1, staying above the nerve root and lateral to that, protecting it at all times with mobilization of it.  An additional radiograph indicating a localization, we were able to identify the disk. There was a small protrusion.  I made the annulotomy.  Mostly, it was the hard disk.  Removed a very  small fragment, which was evacuated, but no further disk was amenable to that.  It appeared that there was more of a bony impingement on the root out into the foramen.  Once this was mobilized and the epidural venous plexus was mobilized, I was able to obtain a centimeter of excursion of the S1 nerve root medial pedicle without tension.  A [place] retractor passed freely up the foramen of S1- L5 and beneath thecal sac.  Thrombin-soaked Gelfoam was placed in laminotomy defect for a period of time to achieve hemostasis.  An inspection revealed no evidence of CSF leakage or active bleeding.  A neural probe passed freely at the foramen of L5 and of S1.  Did appear was a combination of extensive epidural venous plexus, stenotic foramen, epidural fibrosis, and a small disk protrusion impinging upon the S1 nerve root.  There was an area of cortisone remnant noted at the L5-S1 disk space.  All instrumentation removed and again no evidence of CSF leakage or active bleeding.  Thrombin-soaked Gelfoam was placed in laminotomy defect.  We removed the [facility] retractor, irrigated copiously, closed the dorsolumbar fascia with 1 Vicryl, subcu with 2-0, and the skin with Prolene.  Sterile dressing applied.  Extubated without difficulty and transported to the recovery room in satisfactory condition.  The patient tolerated the procedure well.  No complications.  Minimal blood loss.     Susa Day, M.D.   ______________________________ Susa Day, M.D.    [name]  D:  03/18/2017  T:  03/19/2017  Job:  [id number]

## 2017-03-19 NOTE — Evaluation (Signed)
Physical Therapy One Time Evaluation Patient Details Name: [name] MRN: [id number] DOB: 06-Mar-1956 Today's Date: 03/19/2017   History of Present Illness  Pt is a 62 year old female s/p revision L5-S1  Clinical Impression  Patient evaluated by Physical Therapy with no further acute PT needs identified. All education has been completed and the patient has no further questions.  Pt recalls back precautions well and able to ambulate and perform steps without increase in pain.  Pt plans to d/c home later today. See below for any follow-up Physical Therapy or equipment needs. PT is signing off. Thank you for this referral.     Follow Up Recommendations No PT follow up    Equipment Recommendations  None recommended by PT    Recommendations for Other Services       Precautions / Restrictions Precautions Precautions: Back;Fall Precaution Comments: pt able to correctly recall back precautions and maintain during session Restrictions Weight Bearing Restrictions: No      Mobility  Bed Mobility Overal bed mobility: Modified Independent             General bed mobility comments: performed log roll technique correctly  Transfers Overall transfer level: Needs assistance Equipment used: None Transfers: Sit to/from Stand Sit to Stand: Supervision         General transfer comment: cues for back precautions  Ambulation/Gait Ambulation/Gait assistance: Supervision Ambulation Distance (Feet): 400 Feet Assistive device: None Gait Pattern/deviations: Step-through pattern     General Gait Details: slow gait and slightly unsteady at times however no overt LOB, pt reports she has DME at home if needed, tolerated distance well  Stairs Stairs: Yes Stairs assistance: Supervision Stair Management: Step to pattern;Forwards;No rails Number of Stairs: 2 General stair comments: supervision for safety, cues for "up with the good, down with the bad" as pt had been performing  opposite, typically performs step to pattern for safety prior to surgery  Wheelchair Mobility    Modified Rankin (Stroke Patients Only)       Balance                                             Pertinent Vitals/Pain Pain Assessment: 0-10 Pain Score: 3  Faces Pain Scale: Hurts little more Pain Location: back Pain Descriptors / Indicators: Sore Pain Intervention(s): Premedicated before session;Monitored during session    Home Living Family/patient expects to be discharged to:: Private residence Living Arrangements: Non-relatives/Friends Available Help at Discharge: Family Type of Home: House Home Access: Stairs to enter   Technical brewer of Steps: 4-5 Home Layout: One level Home Equipment: Environmental consultant - 2 wheels;Cane - single point      Prior Function Level of Independence: Independent               Hand Dominance        Extremity/Trunk Assessment   Upper Extremity Assessment Upper Extremity Assessment: Overall WFL for tasks assessed    Lower Extremity Assessment Lower Extremity Assessment: Overall WFL for tasks assessed(reports occasional weakness in bil LEs, "good" leg varies per pt)       Communication   Communication: No difficulties  Cognition Arousal/Alertness: Awake/alert Behavior During Therapy: WFL for tasks assessed/performed Overall Cognitive Status: Within Functional Limits for tasks assessed  General Comments      Exercises     Assessment/Plan    PT Assessment Patent does not need any further PT services  PT Problem List         PT Treatment Interventions      PT Goals (Current goals can be found in the Care Plan section)  Acute Rehab PT Goals Patient Stated Goal: return to independence PT Goal Formulation: All assessment and education complete, DC therapy    Frequency     Barriers to discharge        Co-evaluation                AM-PAC PT "6 Clicks" Daily Activity  Outcome Measure Difficulty turning over in bed (including adjusting bedclothes, sheets and blankets)?: None Difficulty moving from lying on back to sitting on the side of the bed? : None Difficulty sitting down on and standing up from a chair with arms (e.g., wheelchair, bedside commode, etc,.)?: None Help needed moving to and from a bed to chair (including a wheelchair)?: A Little Help needed walking in hospital room?: A Little Help needed climbing 3-5 steps with a railing? : A Little 6 Click Score: 21    End of Session   Activity Tolerance: Patient tolerated treatment well Patient left: in bed;with family/visitor present;with call bell/phone within reach   PT Visit Diagnosis: Difficulty in walking, not elsewhere classified (R26.2)    Time: 3762-8315 PT Time Calculation (min) (ACUTE ONLY): 12 min   Charges:   PT Evaluation $PT Eval Low Complexity: 1 Low     PT G CodesCarmelia Bake, PT, DPT 03/19/2017 Pager: [phone]  York Ram E 03/19/2017, 12:40 PM

## 2017-03-19 NOTE — Progress Notes (Signed)
Subjective: 1 Day Post-Op Procedure(s) (LRB): Revision Microlumbar decompression L5-S1 left (Left) Patient reports pain as 3 on 0-10 scale.    Objective: Vital signs in last 24 hours: Temp:  [97.4 F (36.3 C)-99.1 F (37.3 C)] 98.1 F (36.7 C) (01/10 0528) Pulse Rate:  [85-107] 85 (01/10 0528) Resp:  [11-18] 18 (01/10 0528) BP: (100-152)/(56-83) 123/72 (01/10 0528) SpO2:  [98 %-100 %] 100 % (01/10 0528) Weight:  [63.5 kg (140 lb)] 63.5 kg (140 lb) (01/09 0924)  Intake/Output from previous day: 01/09 0701 - 01/10 0700 In: 3037.5 [P.O.:270; I.V.:2412.5; IV Piggyback:355] Out: 7253 [Urine:1640; Blood:10] Intake/Output this shift: No intake/output data recorded.  Recent Labs    03/17/17 0849  HGB 14.1   Recent Labs    03/17/17 0849  WBC 9.7  RBC 4.70  HCT 43.2  PLT 331   Recent Labs    03/17/17 0849 03/19/17 0535  NA 135 137  K 4.3 4.1  CL 100* 104  CO2 26 25  BUN 18 18  CREATININE 0.93 0.81  GLUCOSE 137* 179*  CALCIUM 10.1 9.5   No results for input(s): LABPT, INR in the last 72 hours.  Neurologically intact Intact pulses distally Dorsiflexion/Plantar flexion intact Incision: dressing C/D/I  Assessment/Plan: 1 Day Post-Op Procedure(s) (LRB): Revision Microlumbar decompression L5-S1 left (Left) D/C IV fluids Plan for discharge tomorrow  Instructions given  Makita Blow C 03/19/2017, 7:50 AM

## 2017-03-19 NOTE — Discharge Summary (Signed)
Physician Discharge Summary   Patient ID: [name] MRN: [id number] DOB/AGE: 1955/12/23 62 y.o.  Admit date: 03/18/2017 Discharge date: 03/19/2017  Primary Diagnosis:   HNP Reccurrent L5-S1 Left  Admission Diagnoses:  Past Medical History:  Diagnosis Date  . Arthritis    arhtritis back and hands  . Diabetes mellitus without complication ([place])   . GERD (gastroesophageal reflux disease)   . Hypertension   . Neuromuscular disorder ([place])    nerve pain down right leg   Discharge Diagnoses:   Active Problems:   Spinal stenosis of lumbar region   Spinal stenosis, lumbar  Procedure:  Procedure(s) (LRB): Revision Microlumbar decompression L5-S1 left (Left)   Consults: None  HPI:  see H&P    Laboratory Data: Hospital Outpatient Visit on 03/17/2017  Component Date Value Ref Range Status  . Glucose-Capillary 03/17/2017 174* 65 - 99 mg/dL Final  . Hgb A1c MFr Bld 03/17/2017 7.1* 4.8 - 5.6 % Final   Comment: (NOTE) Pre diabetes:          5.7%-6.4% Diabetes:              >6.4% Glycemic control for   <7.0% adults with diabetes   . Mean Plasma Glucose 03/17/2017 157.07  mg/dL Final   Performed at [facility] [address], [place], [place] [address]  . MRSA, PCR 03/17/2017 NEGATIVE  NEGATIVE Final  . Staphylococcus aureus 03/17/2017 NEGATIVE  NEGATIVE Final   Comment: (NOTE) The Xpert SA Assay (FDA approved for NASAL specimens in patients 82 years of age and older), is one component of a comprehensive surveillance program. It is not intended to diagnose infection nor to guide or monitor treatment.   . Sodium 03/17/2017 135  135 - 145 mmol/L Final  . Potassium 03/17/2017 4.3  3.5 - 5.1 mmol/L Final  . Chloride 03/17/2017 100* 101 - 111 mmol/L Final  . CO2 03/17/2017 26  22 - 32 mmol/L Final  . Glucose, Bld 03/17/2017 137* 65 - 99 mg/dL Final  . BUN 03/17/2017 18  6 - 20 mg/dL Final  . Creatinine, Ser 03/17/2017 0.93  0.44 - 1.00 mg/dL Final  . Calcium  03/17/2017 10.1  8.9 - 10.3 mg/dL Final  . GFR calc non Af Amer 03/17/2017 >60  >60 mL/min Final  . GFR calc Af Amer 03/17/2017 >60  >60 mL/min Final   Comment: (NOTE) The eGFR has been calculated using the CKD EPI equation. This calculation has not been validated in all clinical situations. eGFR's persistently <60 mL/min signify possible Chronic Kidney Disease.   . Anion gap 03/17/2017 9  5 - 15 Final  . WBC 03/17/2017 9.7  4.0 - 10.5 K/uL Final  . RBC 03/17/2017 4.70  3.87 - 5.11 MIL/uL Final  . Hemoglobin 03/17/2017 14.1  12.0 - 15.0 g/dL Final  . HCT 03/17/2017 43.2  36.0 - 46.0 % Final  . MCV 03/17/2017 91.9  78.0 - 100.0 fL Final  . MCH 03/17/2017 30.0  26.0 - 34.0 pg Final  . MCHC 03/17/2017 32.6  30.0 - 36.0 g/dL Final  . RDW 03/17/2017 13.1  11.5 - 15.5 % Final  . Platelets 03/17/2017 331  150 - 400 K/uL Final   Recent Labs    03/17/17 0849  HGB 14.1   Recent Labs    03/17/17 0849  WBC 9.7  RBC 4.70  HCT 43.2  PLT 331   Recent Labs    03/17/17 0849 03/19/17 0535  NA 135 137  K 4.3  4.1  CL 100* 104  CO2 26 25  BUN 18 18  CREATININE 0.93 0.81  GLUCOSE 137* 179*  CALCIUM 10.1 9.5   No results for input(s): LABPT, INR in the last 72 hours.  X-Rays:Dg Lumbar Spine 2-3 Views  Result Date: 03/17/2017 CLINICAL DATA:  Low back pain for several months since a work injury. History of 2 previous back surgery. Patient reports numbness and tingling and pain radiating to the left foot and to the right knee. EXAM: LUMBAR SPINE - 2-3 VIEW COMPARISON:  AP and lateral views of the lumbar spine of February 23, 2014. PA and lateral chest x-ray of Jul 10, 2014 for purposes of vertebral level numbering. FINDINGS: The lumbar vertebral bodies are preserved in height. There is moderate disc space narrowing at L2-3 and L3-4. There is no spondylolisthesis. There mild facet joint hypertrophy at L5-S1. The pedicles and transverse processes are intact. There is very mild curvature convex  toward the left centered at L3 which is stable. The observed portions of the sacrum are normal. IMPRESSION: There is disc space height loss at L2-3 and L3-4 which is not new. There is no compression fracture nor other acute bony abnormality. Electronically Signed   By: David  Martinique M.D.   On: 03/17/2017 09:33   Dg Spine Portable 1 View  Result Date: 03/18/2017 CLINICAL DATA:  Intraoperative imaging. EXAM: PORTABLE SPINE - 1 VIEW COMPARISON:  Lumbar radiographs dated 03/17/2017 FINDINGS: There are 2 instruments at the level of the L5 vertebral body. IMPRESSION: Instruments at L5. Electronically Signed   By: Lorriane Shire M.D.   On: 03/18/2017 13:52   Dg Spine Portable 1 View  Result Date: 03/18/2017 CLINICAL DATA:  Intraoperative imaging. Request for numbering of intervertebral spaces. EXAM: PORTABLE SPINE - 1 VIEW COMPARISON:  Earlier same day FINDINGS: Probe marks the pedicle level of L5. Spreader device directed at the L5-S1 disc level. IMPRESSION: Pedicle L5 and disc level L5-S1 localized. Electronically Signed   By: Nelson Chimes M.D.   On: 03/18/2017 13:08   Dg Spine Portable 1 View  Result Date: 03/18/2017 CLINICAL DATA:  L5-S1 decompression. EXAM: PORTABLE SPINE - 1 VIEW COMPARISON:  Lumbar spine x-rays from same day at 11:56 a.m. FINDINGS: Surgical instrumentation projecting over the L5 spinous process. No acute osseous abnormality. Alignment is normal. IMPRESSION: Intraoperative localization as described above. Electronically Signed   By: Titus Dubin M.D.   On: 03/18/2017 12:37   Dg Spine Portable 1 View  Result Date: 03/18/2017 CLINICAL DATA:  Intraoperative film labeled #1. The patient is to have L5-S1 lumbar decompression. EXAM: PORTABLE SPINE - 1 VIEW COMPARISON:  PA and lateral chest x-ray of March 17, 2017 FINDINGS: The upper needle projects in the superficial soft tissues posterior to the L4 and L5 spinous processes. The lower needle projects in the superficial soft tissues  posterior to the S1 spinous process. IMPRESSION: Intraoperative localization radiograph with findings as described. Electronically Signed   By: David  Martinique M.D.   On: 03/18/2017 12:31    EKG: Orders placed or performed during the hospital encounter of 03/17/17  . EKG 12 lead  . EKG 12 lead     Hospital Course: Patient was admitted to [facility] and taken to the OR and underwent the above state procedure without complications.  Patient tolerated the procedure well and was later transferred to the recovery room and then to the orthopaedic floor for postoperative care.  They were given PO and IV analgesics for pain  control following their surgery.  They were given 24 hours of postoperative antibiotics.   PT was consulted postop to assist with mobility and transfers.  The patient was allowed to be WBAT with therapy and was taught back precautions. Discharge planning was consulted to help with postop disposition and equipment needs.  Patient had a good night on the evening of surgery and started to get up OOB with therapy on day one. Patient was seen in rounds and was ready to go home on day one.  They were given discharge instructions and dressing directions.  They were instructed on when to follow up in the office with Dr. Tonita Cong.   Diet: Diabetic diet Activity:WBAT Follow-up:in 10-14 days Disposition - Home Discharged Condition: good   Discharge Instructions    Call MD / Call 911   Complete by:  As directed    If you experience chest pain or shortness of breath, CALL 911 and be transported to the hospital emergency room.  If you develope a fever above 101 F, pus (white drainage) or increased drainage or redness at the wound, or calf pain, call your surgeon's office.   Constipation Prevention   Complete by:  As directed    Drink plenty of fluids.  Prune juice may be helpful.  You may use a stool softener, such as Colace (over the counter) 100 mg twice a day.  Use MiraLax (over the  counter) for constipation as needed.   Diet - low sodium heart healthy   Complete by:  As directed    Increase activity slowly as tolerated   Complete by:  As directed      Allergies as of 03/19/2017   No Known Allergies     Medication List    TAKE these medications   acetaminophen 500 MG tablet Commonly known as:  TYLENOL Take 2,000 mg by mouth daily as needed for moderate pain.   benazepril-hydrochlorthiazide 10-12.5 MG tablet Commonly known as:  LOTENSIN HCT Take 1 tablet by mouth every morning.   cyclobenzaprine 10 MG tablet Commonly known as:  FLEXERIL Take 10 mg by mouth at bedtime.   docusate sodium 100 MG capsule Commonly known as:  COLACE Take 1 capsule (100 mg total) by mouth 2 (two) times daily as needed for mild constipation. What changed:  how much to take   gabapentin 300 MG capsule Commonly known as:  NEURONTIN Take 600 mg by mouth 2 (two) times daily.   INVOKANA 300 MG Tabs tablet Generic drug:  canagliflozin Take 300 mg by mouth at bedtime.   latanoprost 0.005 % ophthalmic solution Commonly known as:  XALATAN Place 1 drop into both eyes at bedtime.   lovastatin 40 MG tablet Commonly known as:  MEVACOR Take 40 mg by mouth daily.   methocarbamol 500 MG tablet Commonly known as:  ROBAXIN Take 1 tablet (500 mg total) by mouth every 6 (six) hours as needed for muscle spasms. What changed:    when to take this  reasons to take this   oxyCODONE-acetaminophen 5-325 MG tablet Commonly known as:  PERCOCET Take 1 tablet by mouth every 4 (four) hours as needed.   polyethylene glycol packet Commonly known as:  MIRALAX / GLYCOLAX Take 17 g by mouth daily as needed for mild constipation.   ranitidine 300 MG tablet Commonly known as:  ZANTAC Take 300 mg by mouth daily.   TRULICITY 2.53 GU/4.4IH Sopn Generic drug:  Dulaglutide Inject 0.75 mg into the skin every Sunday.   vitamin C  1000 MG tablet Take 1,000 mg by mouth daily.      Follow-up  Information    Susa Day, MD Follow up in 2 week(s).   Specialty:  Orthopedic Surgery Contact information: [address] [place] [address] [phone]           Signed: Lacie Draft, PA-C Orthopaedic Surgery 03/19/2017, 7:49 AM

## 2017-04-08 DIAGNOSIS — M545 Low back pain, unspecified: Secondary | ICD-10-CM | POA: Insufficient documentation

## 2017-04-16 DIAGNOSIS — M545 Low back pain: Secondary | ICD-10-CM | POA: Diagnosis not present

## 2017-04-20 DIAGNOSIS — H40013 Open angle with borderline findings, low risk, bilateral: Secondary | ICD-10-CM | POA: Diagnosis not present

## 2017-04-20 DIAGNOSIS — H524 Presbyopia: Secondary | ICD-10-CM | POA: Diagnosis not present

## 2017-04-23 DIAGNOSIS — M545 Low back pain: Secondary | ICD-10-CM | POA: Diagnosis not present

## 2017-04-29 DIAGNOSIS — M545 Low back pain: Secondary | ICD-10-CM | POA: Diagnosis not present

## 2017-06-22 DIAGNOSIS — J069 Acute upper respiratory infection, unspecified: Secondary | ICD-10-CM | POA: Diagnosis not present

## 2017-07-31 DIAGNOSIS — M519 Unspecified thoracic, thoracolumbar and lumbosacral intervertebral disc disorder: Secondary | ICD-10-CM | POA: Diagnosis not present

## 2017-07-31 DIAGNOSIS — M542 Cervicalgia: Secondary | ICD-10-CM | POA: Diagnosis not present

## 2017-09-11 DIAGNOSIS — E782 Mixed hyperlipidemia: Secondary | ICD-10-CM | POA: Diagnosis not present

## 2017-09-11 DIAGNOSIS — G629 Polyneuropathy, unspecified: Secondary | ICD-10-CM | POA: Diagnosis not present

## 2017-09-11 DIAGNOSIS — I10 Essential (primary) hypertension: Secondary | ICD-10-CM | POA: Diagnosis not present

## 2017-09-11 DIAGNOSIS — E119 Type 2 diabetes mellitus without complications: Secondary | ICD-10-CM | POA: Diagnosis not present

## 2017-09-23 ENCOUNTER — Other Ambulatory Visit: Payer: Self-pay | Admitting: Physician Assistant

## 2017-09-23 DIAGNOSIS — Z1231 Encounter for screening mammogram for malignant neoplasm of breast: Secondary | ICD-10-CM

## 2017-10-19 DIAGNOSIS — M5136 Other intervertebral disc degeneration, lumbar region: Secondary | ICD-10-CM | POA: Diagnosis not present

## 2017-10-19 DIAGNOSIS — M545 Low back pain: Secondary | ICD-10-CM | POA: Diagnosis not present

## 2017-10-19 DIAGNOSIS — M542 Cervicalgia: Secondary | ICD-10-CM | POA: Diagnosis not present

## 2017-10-24 DIAGNOSIS — M542 Cervicalgia: Secondary | ICD-10-CM | POA: Diagnosis not present

## 2017-10-29 ENCOUNTER — Ambulatory Visit
Admission: RE | Admit: 2017-10-29 | Discharge: 2017-10-29 | Disposition: A | Discharge status: Home / Self Care | Payer: BLUE CROSS/BLUE SHIELD | Source: Ambulatory Visit | Attending: Physician Assistant | Admitting: Physician Assistant

## 2017-10-29 DIAGNOSIS — Z1231 Encounter for screening mammogram for malignant neoplasm of breast: Secondary | ICD-10-CM

## 2017-11-11 DIAGNOSIS — M519 Unspecified thoracic, thoracolumbar and lumbosacral intervertebral disc disorder: Secondary | ICD-10-CM | POA: Diagnosis not present

## 2017-11-11 DIAGNOSIS — M542 Cervicalgia: Secondary | ICD-10-CM | POA: Diagnosis not present

## 2017-11-28 DIAGNOSIS — M5030 Other cervical disc degeneration, unspecified cervical region: Secondary | ICD-10-CM | POA: Insufficient documentation

## 2017-12-11 DIAGNOSIS — M50321 Other cervical disc degeneration at C4-C5 level: Secondary | ICD-10-CM | POA: Insufficient documentation

## 2017-12-11 DIAGNOSIS — M519 Unspecified thoracic, thoracolumbar and lumbosacral intervertebral disc disorder: Secondary | ICD-10-CM | POA: Diagnosis not present

## 2017-12-11 DIAGNOSIS — E119 Type 2 diabetes mellitus without complications: Secondary | ICD-10-CM | POA: Insufficient documentation

## 2017-12-11 DIAGNOSIS — M542 Cervicalgia: Secondary | ICD-10-CM | POA: Diagnosis not present

## 2018-01-07 DIAGNOSIS — H40013 Open angle with borderline findings, low risk, bilateral: Secondary | ICD-10-CM | POA: Diagnosis not present

## 2018-01-07 DIAGNOSIS — M5030 Other cervical disc degeneration, unspecified cervical region: Secondary | ICD-10-CM | POA: Diagnosis not present

## 2018-01-11 DIAGNOSIS — L659 Nonscarring hair loss, unspecified: Secondary | ICD-10-CM | POA: Diagnosis not present

## 2018-01-11 DIAGNOSIS — R634 Abnormal weight loss: Secondary | ICD-10-CM | POA: Diagnosis not present

## 2018-01-11 DIAGNOSIS — H579 Unspecified disorder of eye and adnexa: Secondary | ICD-10-CM | POA: Diagnosis not present

## 2018-03-18 DIAGNOSIS — M5030 Other cervical disc degeneration, unspecified cervical region: Secondary | ICD-10-CM | POA: Diagnosis not present

## 2018-03-23 DIAGNOSIS — E119 Type 2 diabetes mellitus without complications: Secondary | ICD-10-CM | POA: Diagnosis not present

## 2018-03-23 DIAGNOSIS — G629 Polyneuropathy, unspecified: Secondary | ICD-10-CM | POA: Diagnosis not present

## 2018-03-23 DIAGNOSIS — Z23 Encounter for immunization: Secondary | ICD-10-CM | POA: Diagnosis not present

## 2018-03-23 DIAGNOSIS — E782 Mixed hyperlipidemia: Secondary | ICD-10-CM | POA: Diagnosis not present

## 2018-03-23 DIAGNOSIS — I10 Essential (primary) hypertension: Secondary | ICD-10-CM | POA: Diagnosis not present

## 2018-04-29 DIAGNOSIS — H40013 Open angle with borderline findings, low risk, bilateral: Secondary | ICD-10-CM | POA: Diagnosis not present

## 2018-05-11 ENCOUNTER — Other Ambulatory Visit: Payer: Self-pay | Admitting: Physician Assistant

## 2018-05-11 DIAGNOSIS — R51 Headache: Secondary | ICD-10-CM

## 2018-05-11 DIAGNOSIS — H0520 Unspecified exophthalmos: Secondary | ICD-10-CM

## 2018-05-11 DIAGNOSIS — R519 Headache, unspecified: Secondary | ICD-10-CM

## 2018-05-20 ENCOUNTER — Ambulatory Visit
Admission: RE | Admit: 2018-05-20 | Discharge: 2018-05-20 | Disposition: A | Discharge status: Home / Self Care | Payer: BLUE CROSS/BLUE SHIELD | Source: Ambulatory Visit | Attending: Physician Assistant | Admitting: Physician Assistant

## 2018-05-20 ENCOUNTER — Other Ambulatory Visit: Payer: Self-pay

## 2018-05-20 DIAGNOSIS — R519 Headache, unspecified: Secondary | ICD-10-CM

## 2018-05-20 DIAGNOSIS — R51 Headache: Secondary | ICD-10-CM | POA: Diagnosis not present

## 2018-05-20 DIAGNOSIS — H0520 Unspecified exophthalmos: Secondary | ICD-10-CM | POA: Diagnosis not present

## 2018-05-20 MED ORDER — IOPAMIDOL (ISOVUE-300) INJECTION 61%
75.0000 mL | Freq: Once | INTRAVENOUS | Status: AC | PRN
  Administered 2018-05-20: 75 mL via INTRAVENOUS

## 2018-06-02 DIAGNOSIS — H0520 Unspecified exophthalmos: Secondary | ICD-10-CM | POA: Diagnosis not present

## 2018-06-02 DIAGNOSIS — E119 Type 2 diabetes mellitus without complications: Secondary | ICD-10-CM | POA: Diagnosis not present

## 2018-06-02 DIAGNOSIS — E0789 Other specified disorders of thyroid: Secondary | ICD-10-CM | POA: Diagnosis not present

## 2018-06-02 DIAGNOSIS — H539 Unspecified visual disturbance: Secondary | ICD-10-CM | POA: Diagnosis not present

## 2018-06-02 DIAGNOSIS — I10 Essential (primary) hypertension: Secondary | ICD-10-CM | POA: Diagnosis not present

## 2018-06-09 DIAGNOSIS — H0520 Unspecified exophthalmos: Secondary | ICD-10-CM | POA: Diagnosis not present

## 2018-06-09 DIAGNOSIS — E119 Type 2 diabetes mellitus without complications: Secondary | ICD-10-CM | POA: Diagnosis not present

## 2018-06-09 DIAGNOSIS — E0789 Other specified disorders of thyroid: Secondary | ICD-10-CM | POA: Diagnosis not present

## 2018-06-09 DIAGNOSIS — I10 Essential (primary) hypertension: Secondary | ICD-10-CM | POA: Diagnosis not present

## 2018-06-10 DIAGNOSIS — M5033 Other cervical disc degeneration, cervicothoracic region: Secondary | ICD-10-CM | POA: Diagnosis not present

## 2018-06-25 ENCOUNTER — Other Ambulatory Visit: Payer: Self-pay | Admitting: Internal Medicine

## 2018-06-25 DIAGNOSIS — H0520 Unspecified exophthalmos: Secondary | ICD-10-CM

## 2018-06-25 DIAGNOSIS — E0789 Other specified disorders of thyroid: Secondary | ICD-10-CM

## 2018-07-15 DIAGNOSIS — H40013 Open angle with borderline findings, low risk, bilateral: Secondary | ICD-10-CM | POA: Diagnosis not present

## 2018-08-27 ENCOUNTER — Other Ambulatory Visit: Payer: BLUE CROSS/BLUE SHIELD

## 2018-12-14 ENCOUNTER — Other Ambulatory Visit: Payer: Self-pay | Admitting: Physician Assistant

## 2018-12-14 DIAGNOSIS — Z1231 Encounter for screening mammogram for malignant neoplasm of breast: Secondary | ICD-10-CM

## 2019-01-13 DIAGNOSIS — K219 Gastro-esophageal reflux disease without esophagitis: Secondary | ICD-10-CM | POA: Diagnosis not present

## 2019-01-13 DIAGNOSIS — Z23 Encounter for immunization: Secondary | ICD-10-CM | POA: Diagnosis not present

## 2019-01-13 DIAGNOSIS — E782 Mixed hyperlipidemia: Secondary | ICD-10-CM | POA: Diagnosis not present

## 2019-01-13 DIAGNOSIS — G629 Polyneuropathy, unspecified: Secondary | ICD-10-CM | POA: Diagnosis not present

## 2019-01-13 DIAGNOSIS — Z1211 Encounter for screening for malignant neoplasm of colon: Secondary | ICD-10-CM | POA: Diagnosis not present

## 2019-01-13 DIAGNOSIS — I10 Essential (primary) hypertension: Secondary | ICD-10-CM | POA: Diagnosis not present

## 2019-01-13 DIAGNOSIS — R946 Abnormal results of thyroid function studies: Secondary | ICD-10-CM | POA: Diagnosis not present

## 2019-01-13 DIAGNOSIS — Z0000 Encounter for general adult medical examination without abnormal findings: Secondary | ICD-10-CM | POA: Diagnosis not present

## 2019-01-13 DIAGNOSIS — E119 Type 2 diabetes mellitus without complications: Secondary | ICD-10-CM | POA: Diagnosis not present

## 2019-01-13 DIAGNOSIS — G44229 Chronic tension-type headache, not intractable: Secondary | ICD-10-CM | POA: Diagnosis not present

## 2019-02-10 ENCOUNTER — Other Ambulatory Visit: Payer: Self-pay

## 2019-02-10 ENCOUNTER — Ambulatory Visit
Admission: RE | Admit: 2019-02-10 | Discharge: 2019-02-10 | Disposition: A | Discharge status: Home / Self Care | Payer: BC Managed Care – PPO | Source: Ambulatory Visit | Attending: Physician Assistant | Admitting: Physician Assistant

## 2019-02-10 DIAGNOSIS — R946 Abnormal results of thyroid function studies: Secondary | ICD-10-CM | POA: Diagnosis not present

## 2019-02-10 DIAGNOSIS — Z1231 Encounter for screening mammogram for malignant neoplasm of breast: Secondary | ICD-10-CM | POA: Diagnosis not present

## 2019-04-01 DIAGNOSIS — Z800 Family history of malignant neoplasm of digestive organs: Secondary | ICD-10-CM | POA: Diagnosis not present

## 2019-05-02 DIAGNOSIS — Z1159 Encounter for screening for other viral diseases: Secondary | ICD-10-CM | POA: Diagnosis not present

## 2019-05-05 DIAGNOSIS — K621 Rectal polyp: Secondary | ICD-10-CM | POA: Diagnosis not present

## 2019-05-05 DIAGNOSIS — Z800 Family history of malignant neoplasm of digestive organs: Secondary | ICD-10-CM | POA: Diagnosis not present

## 2019-05-05 DIAGNOSIS — K5730 Diverticulosis of large intestine without perforation or abscess without bleeding: Secondary | ICD-10-CM | POA: Diagnosis not present

## 2019-05-06 ENCOUNTER — Ambulatory Visit: Payer: Medicare HMO | Attending: Internal Medicine

## 2019-05-06 DIAGNOSIS — Z23 Encounter for immunization: Secondary | ICD-10-CM | POA: Insufficient documentation

## 2019-05-06 NOTE — Progress Notes (Signed)
   Covid-19 Vaccination Clinic  Name:  [name]    MRN: [id number] DOB: 09/09/1955  05/06/2019  [name] was observed post Covid-19 immunization for 15 minutes without incidence. She was provided with Vaccine Information Sheet and instruction to access the V-Safe system.   [name] was instructed to call 911 with any severe reactions post vaccine: Marland Kitchen Difficulty breathing  . Swelling of your face and throat  . A fast heartbeat  . A bad rash all over your body  . Dizziness and weakness    Immunizations Administered    Name Date Dose VIS Date Route   Pfizer COVID-19 Vaccine 05/06/2019 10:21 AM 0.3 mL 02/18/2019 Intramuscular   Manufacturer: [place]   Lot: [id number]   [place]: [id number]

## 2019-05-10 DIAGNOSIS — K621 Rectal polyp: Secondary | ICD-10-CM | POA: Diagnosis not present

## 2019-05-12 DIAGNOSIS — H40013 Open angle with borderline findings, low risk, bilateral: Secondary | ICD-10-CM | POA: Diagnosis not present

## 2019-05-12 DIAGNOSIS — H524 Presbyopia: Secondary | ICD-10-CM | POA: Diagnosis not present

## 2019-05-31 ENCOUNTER — Ambulatory Visit: Payer: Medicare HMO | Attending: Internal Medicine

## 2019-05-31 DIAGNOSIS — Z23 Encounter for immunization: Secondary | ICD-10-CM

## 2019-05-31 NOTE — Progress Notes (Signed)
   Covid-19 Vaccination Clinic  Name:  [name]    MRN: [id number] DOB: 07-Feb-1956  05/31/2019  [name] was observed post Covid-19 immunization for 15 minutes without incident. She was provided with Vaccine Information Sheet and instruction to access the V-Safe system.   [name] was instructed to call 911 with any severe reactions post vaccine: Marland Kitchen Difficulty breathing  . Swelling of face and throat  . A fast heartbeat  . A bad rash all over body  . Dizziness and weakness   Immunizations Administered    Name Date Dose VIS Date Route   Pfizer COVID-19 Vaccine 05/31/2019 12:20 PM 0.3 mL 02/18/2019 Intramuscular   Manufacturer: [place]   Lot: [id number]   [place]: [id number]

## 2019-07-15 DIAGNOSIS — I10 Essential (primary) hypertension: Secondary | ICD-10-CM | POA: Diagnosis not present

## 2019-07-15 DIAGNOSIS — E782 Mixed hyperlipidemia: Secondary | ICD-10-CM | POA: Diagnosis not present

## 2019-07-15 DIAGNOSIS — Z7984 Long term (current) use of oral hypoglycemic drugs: Secondary | ICD-10-CM | POA: Diagnosis not present

## 2019-07-15 DIAGNOSIS — E119 Type 2 diabetes mellitus without complications: Secondary | ICD-10-CM | POA: Diagnosis not present

## 2019-08-11 DIAGNOSIS — H40013 Open angle with borderline findings, low risk, bilateral: Secondary | ICD-10-CM | POA: Diagnosis not present

## 2019-12-08 ENCOUNTER — Ambulatory Visit: Payer: Medicare HMO | Attending: Internal Medicine

## 2019-12-08 DIAGNOSIS — Z23 Encounter for immunization: Secondary | ICD-10-CM

## 2019-12-08 NOTE — Progress Notes (Signed)
   Covid-19 Vaccination Clinic  Name:  [name]    MRN: [id number] DOB: Dec 06, 1955  12/08/2019  [name] was observed post Covid-19 immunization for 15 minutes without incident. She was provided with Vaccine Information Sheet and instruction to access the V-Safe system.   [name] was instructed to call 911 with any severe reactions post vaccine: Marland Kitchen Difficulty breathing  . Swelling of face and throat  . A fast heartbeat  . A bad rash all over body  . Dizziness and weakness

## 2020-01-27 DIAGNOSIS — Z7984 Long term (current) use of oral hypoglycemic drugs: Secondary | ICD-10-CM | POA: Diagnosis not present

## 2020-01-27 DIAGNOSIS — Z23 Encounter for immunization: Secondary | ICD-10-CM | POA: Diagnosis not present

## 2020-01-27 DIAGNOSIS — E782 Mixed hyperlipidemia: Secondary | ICD-10-CM | POA: Diagnosis not present

## 2020-01-27 DIAGNOSIS — I10 Essential (primary) hypertension: Secondary | ICD-10-CM | POA: Diagnosis not present

## 2020-01-27 DIAGNOSIS — Z0000 Encounter for general adult medical examination without abnormal findings: Secondary | ICD-10-CM | POA: Diagnosis not present

## 2020-01-27 DIAGNOSIS — E119 Type 2 diabetes mellitus without complications: Secondary | ICD-10-CM | POA: Diagnosis not present

## 2020-03-08 ENCOUNTER — Other Ambulatory Visit: Payer: Self-pay | Admitting: Nurse Practitioner

## 2020-03-08 DIAGNOSIS — Z1231 Encounter for screening mammogram for malignant neoplasm of breast: Secondary | ICD-10-CM

## 2020-03-30 ENCOUNTER — Other Ambulatory Visit: Payer: Self-pay

## 2020-03-30 ENCOUNTER — Ambulatory Visit
Admission: RE | Admit: 2020-03-30 | Discharge: 2020-03-30 | Disposition: A | Discharge status: Home / Self Care | Payer: Medicare HMO | Source: Ambulatory Visit | Attending: Nurse Practitioner | Admitting: Nurse Practitioner

## 2020-03-30 DIAGNOSIS — Z1231 Encounter for screening mammogram for malignant neoplasm of breast: Secondary | ICD-10-CM

## 2020-04-02 DIAGNOSIS — M25562 Pain in left knee: Secondary | ICD-10-CM | POA: Diagnosis not present

## 2020-04-21 DIAGNOSIS — M25562 Pain in left knee: Secondary | ICD-10-CM | POA: Diagnosis not present

## 2020-04-27 DIAGNOSIS — M25562 Pain in left knee: Secondary | ICD-10-CM | POA: Diagnosis not present

## 2020-05-04 DIAGNOSIS — K219 Gastro-esophageal reflux disease without esophagitis: Secondary | ICD-10-CM | POA: Diagnosis not present

## 2020-05-04 DIAGNOSIS — Z01810 Encounter for preprocedural cardiovascular examination: Secondary | ICD-10-CM | POA: Diagnosis not present

## 2020-05-17 DIAGNOSIS — H40013 Open angle with borderline findings, low risk, bilateral: Secondary | ICD-10-CM | POA: Diagnosis not present

## 2020-05-17 DIAGNOSIS — H524 Presbyopia: Secondary | ICD-10-CM | POA: Diagnosis not present

## 2020-06-04 DIAGNOSIS — M94262 Chondromalacia, left knee: Secondary | ICD-10-CM | POA: Diagnosis not present

## 2020-06-04 DIAGNOSIS — M2242 Chondromalacia patellae, left knee: Secondary | ICD-10-CM | POA: Diagnosis not present

## 2020-06-04 DIAGNOSIS — S83262A Peripheral tear of lateral meniscus, current injury, left knee, initial encounter: Secondary | ICD-10-CM | POA: Diagnosis not present

## 2020-06-04 DIAGNOSIS — Y999 Unspecified external cause status: Secondary | ICD-10-CM | POA: Diagnosis not present

## 2020-06-04 DIAGNOSIS — S83242A Other tear of medial meniscus, current injury, left knee, initial encounter: Secondary | ICD-10-CM | POA: Diagnosis not present

## 2020-06-04 DIAGNOSIS — M948X6 Other specified disorders of cartilage, lower leg: Secondary | ICD-10-CM | POA: Diagnosis not present

## 2020-06-04 DIAGNOSIS — M23242 Derangement of anterior horn of lateral meniscus due to old tear or injury, left knee: Secondary | ICD-10-CM | POA: Diagnosis not present

## 2020-06-04 DIAGNOSIS — M1712 Unilateral primary osteoarthritis, left knee: Secondary | ICD-10-CM | POA: Diagnosis not present

## 2020-06-04 DIAGNOSIS — G8918 Other acute postprocedural pain: Secondary | ICD-10-CM | POA: Diagnosis not present

## 2020-06-04 DIAGNOSIS — M23212 Derangement of anterior horn of medial meniscus due to old tear or injury, left knee: Secondary | ICD-10-CM | POA: Diagnosis not present

## 2020-06-04 DIAGNOSIS — X58XXXA Exposure to other specified factors, initial encounter: Secondary | ICD-10-CM | POA: Diagnosis not present

## 2020-07-12 DIAGNOSIS — M1712 Unilateral primary osteoarthritis, left knee: Secondary | ICD-10-CM | POA: Diagnosis not present

## 2020-07-12 DIAGNOSIS — M170 Bilateral primary osteoarthritis of knee: Secondary | ICD-10-CM | POA: Diagnosis not present

## 2020-07-12 DIAGNOSIS — M1711 Unilateral primary osteoarthritis, right knee: Secondary | ICD-10-CM | POA: Diagnosis not present

## 2020-07-27 DIAGNOSIS — E782 Mixed hyperlipidemia: Secondary | ICD-10-CM | POA: Diagnosis not present

## 2020-07-27 DIAGNOSIS — G629 Polyneuropathy, unspecified: Secondary | ICD-10-CM | POA: Diagnosis not present

## 2020-07-27 DIAGNOSIS — E1149 Type 2 diabetes mellitus with other diabetic neurological complication: Secondary | ICD-10-CM | POA: Diagnosis not present

## 2020-07-27 DIAGNOSIS — Z7984 Long term (current) use of oral hypoglycemic drugs: Secondary | ICD-10-CM | POA: Diagnosis not present

## 2020-07-27 DIAGNOSIS — R946 Abnormal results of thyroid function studies: Secondary | ICD-10-CM | POA: Diagnosis not present

## 2020-07-27 DIAGNOSIS — I10 Essential (primary) hypertension: Secondary | ICD-10-CM | POA: Diagnosis not present

## 2020-08-08 DIAGNOSIS — R946 Abnormal results of thyroid function studies: Secondary | ICD-10-CM | POA: Diagnosis not present

## 2020-08-16 DIAGNOSIS — H539 Unspecified visual disturbance: Secondary | ICD-10-CM | POA: Diagnosis not present

## 2020-08-16 DIAGNOSIS — E119 Type 2 diabetes mellitus without complications: Secondary | ICD-10-CM | POA: Diagnosis not present

## 2020-08-16 DIAGNOSIS — H0520 Unspecified exophthalmos: Secondary | ICD-10-CM | POA: Diagnosis not present

## 2020-08-16 DIAGNOSIS — Z6824 Body mass index (BMI) 24.0-24.9, adult: Secondary | ICD-10-CM | POA: Diagnosis not present

## 2020-08-16 DIAGNOSIS — I10 Essential (primary) hypertension: Secondary | ICD-10-CM | POA: Diagnosis not present

## 2020-08-16 DIAGNOSIS — E0789 Other specified disorders of thyroid: Secondary | ICD-10-CM | POA: Diagnosis not present

## 2020-08-16 DIAGNOSIS — R946 Abnormal results of thyroid function studies: Secondary | ICD-10-CM | POA: Diagnosis not present

## 2020-09-25 DIAGNOSIS — R946 Abnormal results of thyroid function studies: Secondary | ICD-10-CM | POA: Diagnosis not present

## 2020-10-02 DIAGNOSIS — E119 Type 2 diabetes mellitus without complications: Secondary | ICD-10-CM | POA: Diagnosis not present

## 2020-10-02 DIAGNOSIS — I10 Essential (primary) hypertension: Secondary | ICD-10-CM | POA: Diagnosis not present

## 2020-10-02 DIAGNOSIS — H539 Unspecified visual disturbance: Secondary | ICD-10-CM | POA: Diagnosis not present

## 2020-10-02 DIAGNOSIS — E0789 Other specified disorders of thyroid: Secondary | ICD-10-CM | POA: Diagnosis not present

## 2020-10-02 DIAGNOSIS — R946 Abnormal results of thyroid function studies: Secondary | ICD-10-CM | POA: Diagnosis not present

## 2020-10-02 DIAGNOSIS — H0520 Unspecified exophthalmos: Secondary | ICD-10-CM | POA: Diagnosis not present

## 2020-10-02 DIAGNOSIS — Z6824 Body mass index (BMI) 24.0-24.9, adult: Secondary | ICD-10-CM | POA: Diagnosis not present

## 2020-10-22 DIAGNOSIS — T148XXA Other injury of unspecified body region, initial encounter: Secondary | ICD-10-CM | POA: Diagnosis not present

## 2020-10-22 DIAGNOSIS — S91133A Puncture wound without foreign body of unspecified great toe without damage to nail, initial encounter: Secondary | ICD-10-CM | POA: Diagnosis not present

## 2020-10-22 DIAGNOSIS — L03039 Cellulitis of unspecified toe: Secondary | ICD-10-CM | POA: Diagnosis not present

## 2020-11-22 DIAGNOSIS — H40013 Open angle with borderline findings, low risk, bilateral: Secondary | ICD-10-CM | POA: Diagnosis not present

## 2021-01-11 DIAGNOSIS — M1712 Unilateral primary osteoarthritis, left knee: Secondary | ICD-10-CM | POA: Diagnosis not present

## 2021-01-16 ENCOUNTER — Telehealth: Payer: Self-pay | Admitting: Pharmacy Technician

## 2021-01-16 DIAGNOSIS — Z596 Low income: Secondary | ICD-10-CM

## 2021-01-16 NOTE — Progress Notes (Signed)
[facility])                                            [place] Team    01/16/2021  Harbour Nordmeyer Peppers Jan 12, 1956 [id number]                                      Medication Assistance Referral  Referral From:  Kristeen Miss, PharmD  Medication/Company: [employer]ittle Patient application portion:  Mailed Provider application portion: Faxed  to Carolee Rota, NP Provider address/fax verified via: Office website   Jermell Holeman P. Leida [place], [place]  [phone]

## 2021-01-17 DIAGNOSIS — E1149 Type 2 diabetes mellitus with other diabetic neurological complication: Secondary | ICD-10-CM | POA: Diagnosis not present

## 2021-01-17 DIAGNOSIS — I10 Essential (primary) hypertension: Secondary | ICD-10-CM | POA: Diagnosis not present

## 2021-01-17 DIAGNOSIS — Z7984 Long term (current) use of oral hypoglycemic drugs: Secondary | ICD-10-CM | POA: Diagnosis not present

## 2021-01-17 DIAGNOSIS — E782 Mixed hyperlipidemia: Secondary | ICD-10-CM | POA: Diagnosis not present

## 2021-01-17 DIAGNOSIS — G629 Polyneuropathy, unspecified: Secondary | ICD-10-CM | POA: Diagnosis not present

## 2021-01-17 DIAGNOSIS — Z23 Encounter for immunization: Secondary | ICD-10-CM | POA: Diagnosis not present

## 2021-02-13 DIAGNOSIS — K219 Gastro-esophageal reflux disease without esophagitis: Secondary | ICD-10-CM | POA: Diagnosis not present

## 2021-02-13 DIAGNOSIS — I10 Essential (primary) hypertension: Secondary | ICD-10-CM | POA: Diagnosis not present

## 2021-02-13 DIAGNOSIS — Z0000 Encounter for general adult medical examination without abnormal findings: Secondary | ICD-10-CM | POA: Diagnosis not present

## 2021-02-13 DIAGNOSIS — E119 Type 2 diabetes mellitus without complications: Secondary | ICD-10-CM | POA: Diagnosis not present

## 2021-02-13 DIAGNOSIS — L989 Disorder of the skin and subcutaneous tissue, unspecified: Secondary | ICD-10-CM | POA: Diagnosis not present

## 2021-02-13 DIAGNOSIS — E782 Mixed hyperlipidemia: Secondary | ICD-10-CM | POA: Diagnosis not present

## 2021-02-18 ENCOUNTER — Telehealth: Payer: Self-pay | Admitting: Pharmacy Technician

## 2021-02-18 DIAGNOSIS — Z596 Low income: Secondary | ICD-10-CM

## 2021-02-18 NOTE — Progress Notes (Signed)
[facility])                                            [place] Team    02/18/2021  [name] [birth year] [id number]  Received both patient and provider portion(s) of patient assistance application(s) for Trulicity. Faxed completed application and required documents into [employer].   Ginger Leeth P. [name], [place]  [phone]

## 2021-02-28 IMAGING — CT CT HEAD WITHOUT AND WITH CONTRAST
1 of 2 series · 13 of 30 positions shown, 17 images · IV contrast (iopamidol)
Comparison: None.

CLINICAL DATA: Proptosis, and headache.

EXAM:
CT HEAD WITHOUT AND WITH CONTRAST
TECHNIQUE: Contiguous axial images were obtained from the base of the skull
through the vertex without and with intravenous contrast
CONTRAST:  75mL 9KI62V-SFF IOPAMIDOL (9KI62V-SFF) INJECTION 61%

[Series 2: head w/(date) · axial · 0.47mm/px · z∈[-148,-3]mm · 13 of 35 slices shown, 17 images]
[im 3/35  brain]
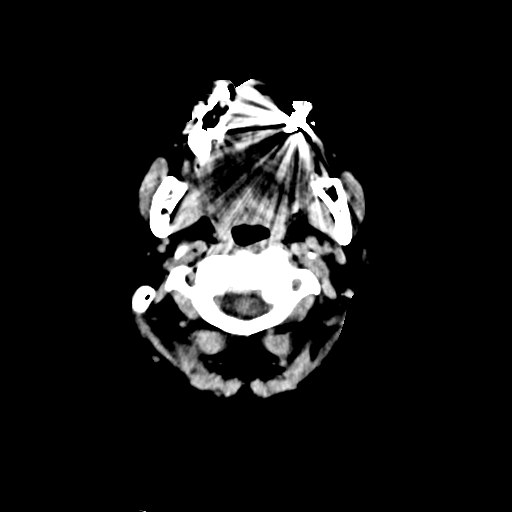
[im 3/35  bone]
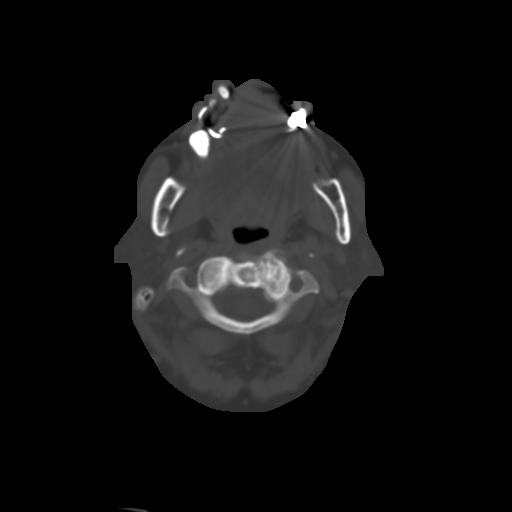
[im 5/35  brain]
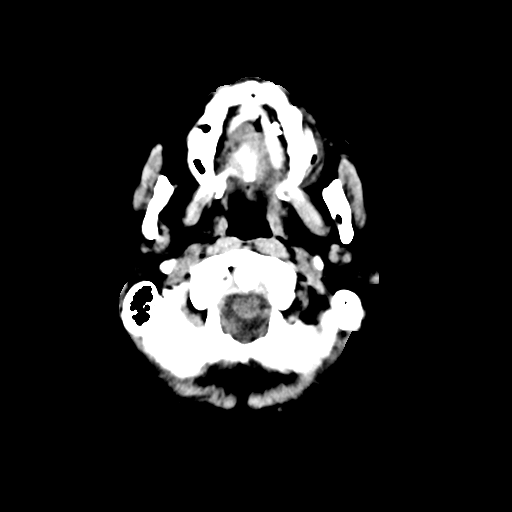
[im 8/35  brain]
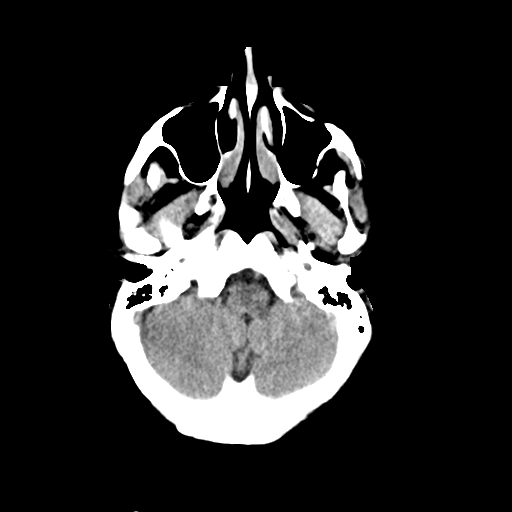
[im 10/35  brain]
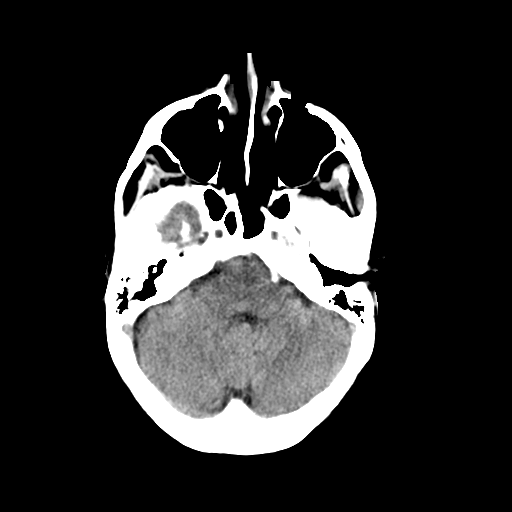
[im 13/35  brain]
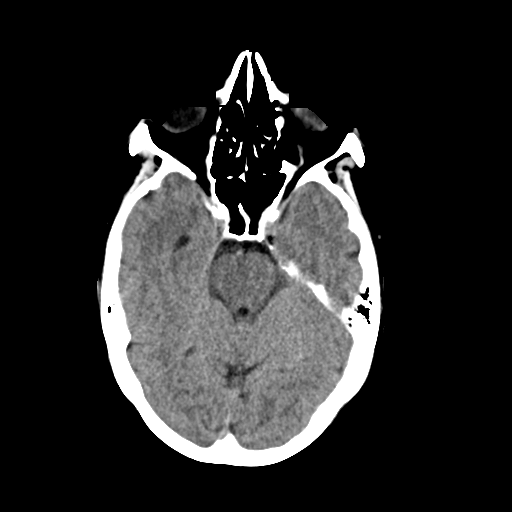
[im 13/35  bone]
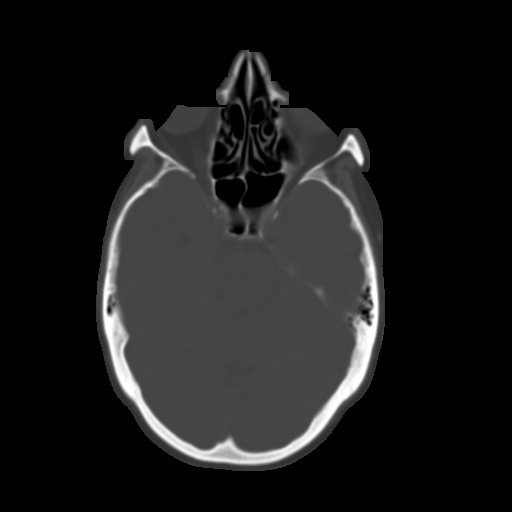
[im 15/35  brain]
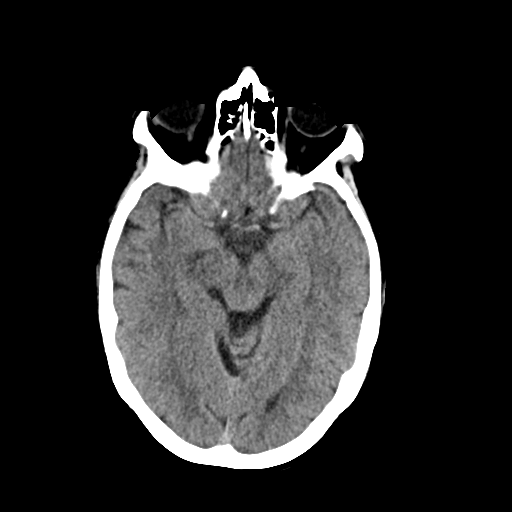
[im 18/35  brain]
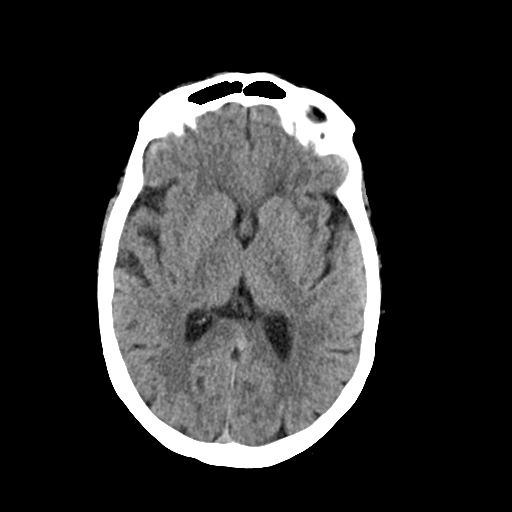
[im 20/35  brain]
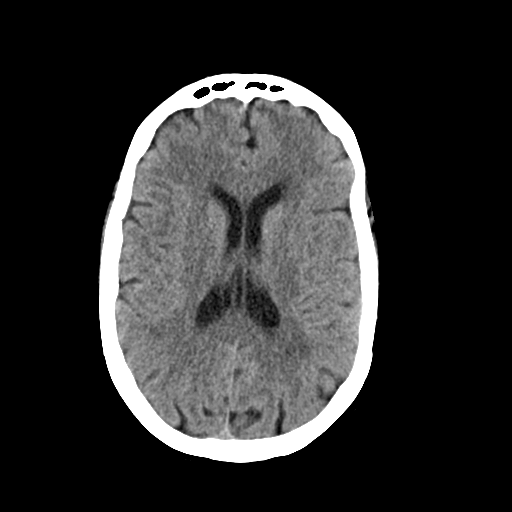
[im 22/35  brain]
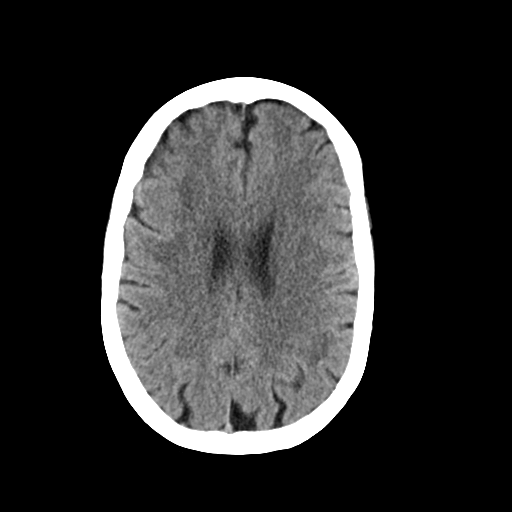
[im 22/35  bone]
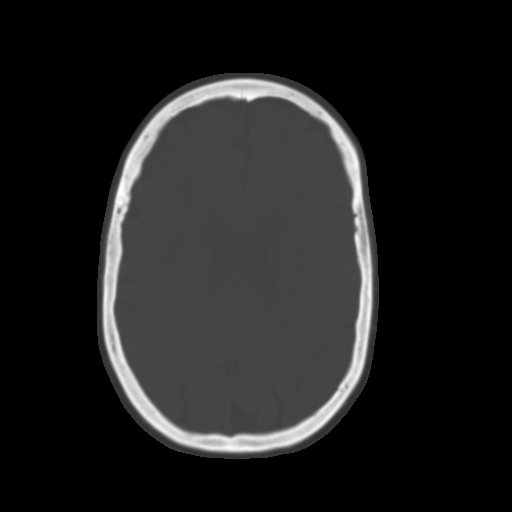
[im 25/35  brain]
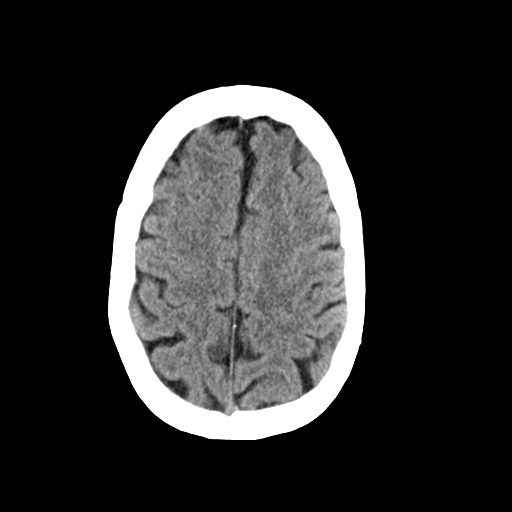
[im 27/35  brain]
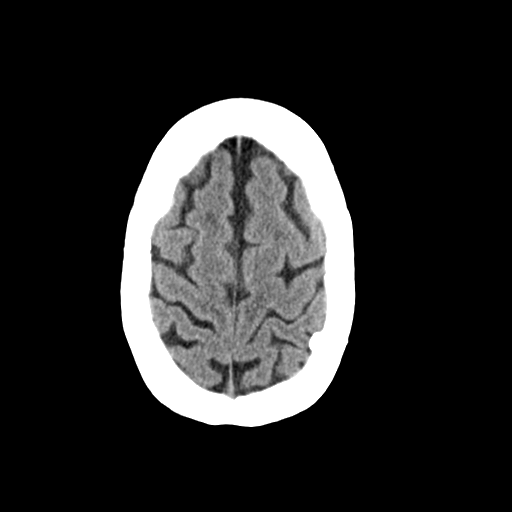
[im 30/35  brain]
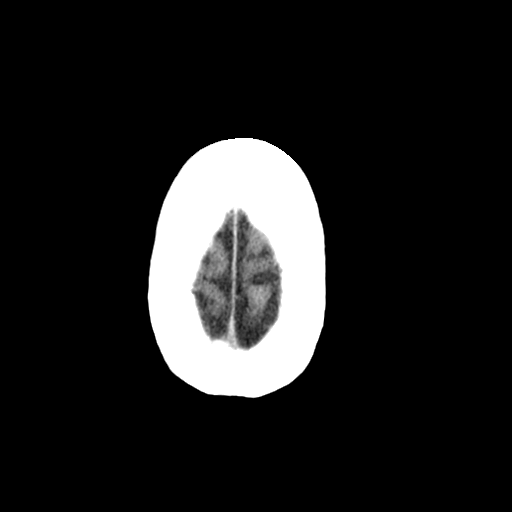
[im 32/35  brain]
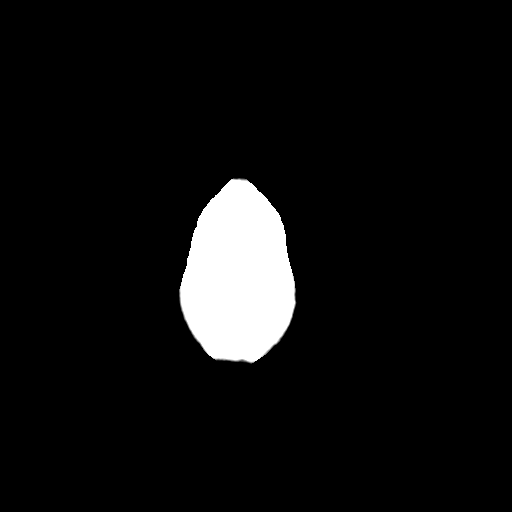
[im 32/35  bone]
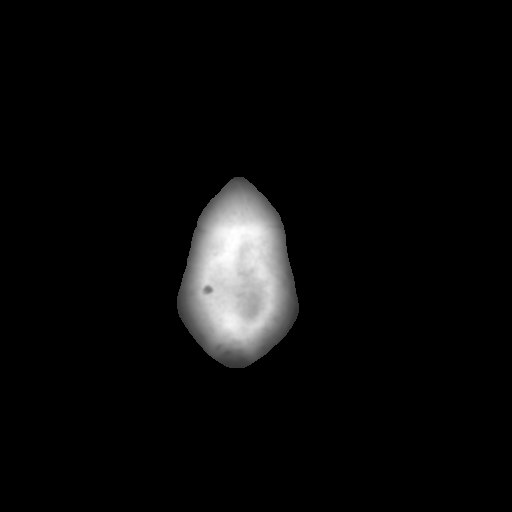

[13 of 30 positions shown; findings below may reference images not displayed]

FINDINGS: Brain: No evidence for acute infarction, hemorrhage, mass lesion,
hydrocephalus, or extra-axial fluid. Normal for age cerebral volume.
Hypoattenuation of white matter, likely small vessel disease.

Post infusion, no abnormal enhancement of the brain or visible
meninges.

Vascular: Calcification of the cavernous internal carotid arteries
consistent with cerebrovascular atherosclerotic disease. No signs of
intracranial large vessel occlusion. Visible vessels are patent.

Skull: Calvarium intact.

Sinuses/Orbits: No acute finding. The degree of proptosis appears
symmetric. There are dense lenticular opacities. No retrobulbar
masses. The inferior and medial recti of the orbits bilaterally are
minimally enlarged, it is possible that this represents a subtle
manifestation of thyroid ophthalmopathy. Correlate clinically.

Other: None.
IMPRESSION: Normal for age cerebral volume. Hypoattenuation of white matter,
likely small vessel disease.

No acute intracranial findings. No abnormal postcontrast
enhancement.

Equivocal findings which could suggest subtle thyroid
ophthalmopathy. No retrobulbar masses.

## 2021-03-22 DIAGNOSIS — L570 Actinic keratosis: Secondary | ICD-10-CM | POA: Diagnosis not present

## 2021-03-25 ENCOUNTER — Telehealth: Payer: Self-pay | Admitting: Pharmacy Technician

## 2021-03-25 DIAGNOSIS — Z596 Low income: Secondary | ICD-10-CM

## 2021-03-25 NOTE — Progress Notes (Signed)
[facility])                                            [place] Team    03/25/2021  [name] 1956/01/08 [id number]  Care coordination call placed to [place] in regard to Trulicity application.  Spoke to [name] who informs patient is APPROVED 03/10/21-03/09/22. She informs medication will ship based on last fill date in 2022 to patient's home.  [place] Selke P. Sammie Schermerhorn, [place]  [phone]

## 2021-04-26 DIAGNOSIS — D225 Melanocytic nevi of trunk: Secondary | ICD-10-CM | POA: Diagnosis not present

## 2021-04-26 DIAGNOSIS — L821 Other seborrheic keratosis: Secondary | ICD-10-CM | POA: Diagnosis not present

## 2021-04-26 DIAGNOSIS — L814 Other melanin hyperpigmentation: Secondary | ICD-10-CM | POA: Diagnosis not present

## 2021-05-02 DIAGNOSIS — E0590 Thyrotoxicosis, unspecified without thyrotoxic crisis or storm: Secondary | ICD-10-CM | POA: Diagnosis not present

## 2021-05-02 DIAGNOSIS — E782 Mixed hyperlipidemia: Secondary | ICD-10-CM | POA: Diagnosis not present

## 2021-05-02 DIAGNOSIS — I10 Essential (primary) hypertension: Secondary | ICD-10-CM | POA: Diagnosis not present

## 2021-05-02 DIAGNOSIS — Z7984 Long term (current) use of oral hypoglycemic drugs: Secondary | ICD-10-CM | POA: Diagnosis not present

## 2021-05-02 DIAGNOSIS — E1149 Type 2 diabetes mellitus with other diabetic neurological complication: Secondary | ICD-10-CM | POA: Diagnosis not present

## 2021-05-07 ENCOUNTER — Other Ambulatory Visit: Payer: Self-pay | Admitting: Nurse Practitioner

## 2021-05-07 DIAGNOSIS — Z1231 Encounter for screening mammogram for malignant neoplasm of breast: Secondary | ICD-10-CM

## 2021-05-08 ENCOUNTER — Ambulatory Visit
Admission: RE | Admit: 2021-05-08 | Discharge: 2021-05-08 | Disposition: A | Discharge status: Home / Self Care | Payer: Medicare HMO | Source: Ambulatory Visit | Attending: Nurse Practitioner | Admitting: Nurse Practitioner

## 2021-05-08 DIAGNOSIS — Z1231 Encounter for screening mammogram for malignant neoplasm of breast: Secondary | ICD-10-CM | POA: Diagnosis not present

## 2021-05-24 DIAGNOSIS — M25562 Pain in left knee: Secondary | ICD-10-CM | POA: Diagnosis not present

## 2021-05-30 DIAGNOSIS — H524 Presbyopia: Secondary | ICD-10-CM | POA: Diagnosis not present

## 2021-05-30 DIAGNOSIS — H40013 Open angle with borderline findings, low risk, bilateral: Secondary | ICD-10-CM | POA: Diagnosis not present

## 2021-05-30 DIAGNOSIS — E119 Type 2 diabetes mellitus without complications: Secondary | ICD-10-CM | POA: Diagnosis not present

## 2021-09-16 DIAGNOSIS — M25562 Pain in left knee: Secondary | ICD-10-CM | POA: Diagnosis not present

## 2022-01-10 DIAGNOSIS — M25562 Pain in left knee: Secondary | ICD-10-CM | POA: Diagnosis not present

## 2022-01-10 DIAGNOSIS — M25512 Pain in left shoulder: Secondary | ICD-10-CM | POA: Diagnosis not present

## 2022-01-14 DIAGNOSIS — K219 Gastro-esophageal reflux disease without esophagitis: Secondary | ICD-10-CM | POA: Diagnosis not present

## 2022-01-14 DIAGNOSIS — I10 Essential (primary) hypertension: Secondary | ICD-10-CM | POA: Diagnosis not present

## 2022-01-14 DIAGNOSIS — M1712 Unilateral primary osteoarthritis, left knee: Secondary | ICD-10-CM | POA: Diagnosis not present

## 2022-01-14 DIAGNOSIS — E782 Mixed hyperlipidemia: Secondary | ICD-10-CM | POA: Diagnosis not present

## 2022-01-14 DIAGNOSIS — Z01810 Encounter for preprocedural cardiovascular examination: Secondary | ICD-10-CM | POA: Diagnosis not present

## 2022-01-14 DIAGNOSIS — E119 Type 2 diabetes mellitus without complications: Secondary | ICD-10-CM | POA: Diagnosis not present

## 2022-01-14 DIAGNOSIS — E0590 Thyrotoxicosis, unspecified without thyrotoxic crisis or storm: Secondary | ICD-10-CM | POA: Diagnosis not present

## 2022-01-20 ENCOUNTER — Ambulatory Visit: Payer: Self-pay | Admitting: Orthopedic Surgery

## 2022-01-21 ENCOUNTER — Telehealth: Payer: Self-pay

## 2022-01-21 NOTE — Progress Notes (Signed)
[facility]) Care Management  Antoine   01/21/2022  ROSLYN ELSE 07-17-1955 [id number]   2024 Medication Assistance Renewal Application Summary:  Patient was outreached by [place] Team regarding medication assistance renewal for 2024. Verified address, anticipated insurance for 2024, and income has not changed. Patient remains interested in PAP for [id number] for Trulicity, no other new medications were identified for medication assistance. Confirmed Invokana was discontinued and stopped.    Medication Assistance Findings:  Medication assistance needs identified: Trulicity    Plan: I will route patient assistance letter to [place] technician who will coordinate patient assistance program application process for medications listed above.  [facility] pharmacy technician will assist with obtaining all required documents from both patient and provider(s) and submit application(s) once completed.    Thank you for allowing pharmacy to be a part of this patient's care.  Kristeen Miss, PharmD Clinical Pharmacist [place] Cell: [phone]

## 2022-01-22 DIAGNOSIS — H2513 Age-related nuclear cataract, bilateral: Secondary | ICD-10-CM | POA: Diagnosis not present

## 2022-01-22 DIAGNOSIS — H16223 Keratoconjunctivitis sicca, not specified as Sjogren's, bilateral: Secondary | ICD-10-CM | POA: Diagnosis not present

## 2022-01-22 DIAGNOSIS — H524 Presbyopia: Secondary | ICD-10-CM | POA: Diagnosis not present

## 2022-01-22 DIAGNOSIS — H40013 Open angle with borderline findings, low risk, bilateral: Secondary | ICD-10-CM | POA: Diagnosis not present

## 2022-01-22 DIAGNOSIS — E119 Type 2 diabetes mellitus without complications: Secondary | ICD-10-CM | POA: Diagnosis not present

## 2022-01-22 NOTE — Patient Instructions (Addendum)
DUE TO COVID-19 ONLY TWO VISITORS  (aged 68 and older)  ARE ALLOWED TO COME WITH YOU AND STAY IN THE WAITING ROOM ONLY DURING PRE OP AND PROCEDURE.   **NO VISITORS ARE ALLOWED IN THE SHORT STAY AREA OR RECOVERY ROOM!!**  IF YOU WILL BE ADMITTED INTO THE HOSPITAL YOU ARE ALLOWED ONLY FOUR SUPPORT PEOPLE DURING VISITATION HOURS ONLY (7 AM -8PM)   The support person(s) must pass our screening, gel in and out, and wear a mask at all times, including in the patient's room. Patients must also wear a mask when staff or their support person are in the room. Visitors GUEST BADGE MUST BE WORN VISIBLY  One adult visitor may remain with you overnight and MUST be in the room by 8 P.M.     Your procedure is scheduled on: 02/05/22   Report to [facility] Main Entrance    Report to admitting at 5:15 AM   Call this number if you have problems the morning of surgery [phone]   Do not eat food :After Midnight.   After Midnight you may have the following liquids until _4:30_AM/ DAY OF SURGERY  Water Black Coffee (sugar ok, NO MILK/CREAM OR CREAMERS)  Tea (sugar ok, NO MILK/CREAM OR CREAMERS) regular and decaf                             Plain Jell-O (NO RED)                                           Fruit ices (not with fruit pulp, NO RED)                                     Popsicles (NO RED)                                                                  Juice: apple, WHITE grape, WHITE cranberry Sports drinks like Gatorade (NO RED)                   The day of surgery:  Drink ONE G2 at 4:15 AM the morning of surgery. Drink in one sitting. Do not sip.  This drink was given to you during your hospital  pre-op appointment visit. Nothing else to drink after completing the  G2. At 4:30 AM          If you have questions, please contact your surgeon's office.   FOLLOW BOWEL PREP AND ANY ADDITIONAL PRE OP INSTRUCTIONS YOU RECEIVED FROM YOUR SURGEON'S OFFICE!!!     Oral Hygiene is  also important to reduce your risk of infection.                                    Remember - BRUSH YOUR TEETH THE MORNING OF SURGERY WITH YOUR REGULAR TOOTHPASTE    Take these medicines the morning of surgery with A SIP OF WATER: Pain med if  needed                                                                                                                                                                                                                                                         How to Manage Your Diabetes Before and After Surgery  Why is it important to control my blood sugar before and after surgery? Improving blood sugar levels before and after surgery helps healing and can limit problems. A way of improving blood sugar control is eating a healthy diet by:  Eating less sugar and carbohydrates  Increasing activity/exercise  Talking with your doctor about reaching your blood sugar goals High blood sugars (greater than 180 mg/dL) can raise your risk of infections and slow your recovery, so you will need to focus on controlling your diabetes during the weeks before surgery. Make sure that the doctor who takes care of your diabetes knows about your planned surgery including the date and location.  How do I manage my blood sugar before surgery? Check your blood sugar at least 4 times a day, starting 2 days before surgery, to make sure that the level is not too high or low. Check your blood sugar the morning of your surgery when you wake up and every 2 hours until you get to the Short Stay unit. If your blood sugar is less than 70 mg/dL, you will need to treat for low blood sugar: Do not take insulin. Treat a low blood sugar (less than 70 mg/dL) with  cup of clear juice (cranberry or apple), 4 glucose tablets, OR glucose gel. Recheck blood sugar in 15 minutes after treatment (to make sure it is greater than 70 mg/dL). If your blood sugar is not greater than 70 mg/dL on recheck,  call [phone] for further instructions. Report your blood sugar to the short stay nurse when you get to Short Stay.  If you are admitted to the hospital after surgery: Your blood sugar will be checked by the staff and you will probably be given insulin after surgery (instead of oral diabetes medicines) to make sure you have good blood sugar levels. The goal for blood sugar control after surgery is 80-180 mg/dL.   WHAT DO I DO ABOUT MY DIABETES MEDICATION?  DO NOT TAKE THE FOLLOWING 7 DAYS PRIOR TO SURGERY: Ozempic, Wegovy, Rybelsus (Semaglutide), Byetta (exenatide), Bydureon (exenatide ER), Victoza, Saxenda (liraglutide), or Trulicity (dulaglutide) Mounjaro (Tirzepatide) Adlyxin (Lixisenatide), Polyethylene Glycol Loxenatide.Do not take your Sunday 29HBZ dose of Trulicity. Hold 11/22 don't take the 11/26 th dose.                              You may not have any metal on your body including hair pins, jewelry, and body piercing             Do not wear make-up, lotions, powders, perfumes/cologne, or deodorant  Do not wear nail polish including gel and S&S, artificial/acrylic nails, or any other type of covering on natural nails including finger and toenails. If you have artificial nails, gel coating, etc. that needs to be removed by a nail salon please have this removed prior to surgery or surgery may need to be canceled/ delayed if the surgeon/ anesthesia feels like they are unable to be safely monitored.   Do not shave  48 hours prior to surgery.     Do not bring valuables to the hospital. [place].   Contacts, dentures or bridgework may not be worn into surgery.   Bring small overnight bag day of surgery.   DO NOT [place].     Special Instructions: Bring a copy of your healthcare power of attorney and living will documents the day of surgery if you haven't scanned them  before.              Please read over the following fact sheets you were given: IF YOU HAVE QUESTIONS ABOUT YOUR PRE-OP INSTRUCTIONS PLEASE CALL [phone]    [facility] - Preparing for Surgery Before surgery, you can play an important role.  Because skin is not sterile, your skin needs to be as free of germs as possible.  You can reduce the number of germs on your skin by washing with CHG (chlorahexidine gluconate) soap before surgery.  CHG is an antiseptic cleaner which kills germs and bonds with the skin to continue killing germs even after washing. Please DO NOT use if you have an allergy to CHG or antibacterial soaps.  If your skin becomes reddened/irritated stop using the CHG and inform your nurse when you arrive at Short Stay. Do not shave (including legs and underarms) for at least 48 hours prior to the first CHG shower.   Please follow these instructions carefully:  1.  Shower with CHG Soap the night before surgery and the  morning of Surgery.  2.  If you choose to wash your hair, wash your hair first as usual with your  normal  shampoo.  3.  After you shampoo, rinse your hair and body thoroughly to remove the  shampoo.                            4.  Use CHG as you would any other liquid soap.  You can apply chg directly  to the skin and wash                       Gently with a scrungie or clean washcloth.  5.  Apply the CHG Soap to  your body ONLY FROM THE NECK DOWN.   Do not use on face/ open                           Wound or open sores. Avoid contact with eyes, ears mouth and genitals (private parts).                       Wash face,  Genitals (private parts) with your normal soap.             6.  Wash thoroughly, paying special attention to the area where your surgery  will be performed.  7.  Thoroughly rinse your body with warm water from the neck down.  8.  DO NOT shower/wash with your normal soap after using and rinsing off  the CHG Soap.                9.  Pat yourself dry  with a clean towel.            10.  Wear clean pajamas.            11.  Place clean sheets on your bed the night of your first shower and do not  sleep with pets. Day of Surgery : Do not apply any lotions/deodorants the morning of surgery.  Please wear clean clothes to the hospital/surgery center.  FAILURE TO FOLLOW THESE INSTRUCTIONS MAY RESULT IN THE CANCELLATION OF YOUR SURGERY   ________________________________________________________________________  Incentive Spirometer  An incentive spirometer is a tool that can help keep your lungs clear and active. This tool measures how well you are filling your lungs with each breath. Taking long deep breaths may help reverse or decrease the chance of developing breathing (pulmonary) problems (especially infection) following: A long period of time when you are unable to move or be active. BEFORE THE PROCEDURE  If the spirometer includes an indicator to show your best effort, your nurse or respiratory therapist will set it to a desired goal. If possible, sit up straight or lean slightly forward. Try not to slouch. Hold the incentive spirometer in an upright position. INSTRUCTIONS FOR USE  Sit on the edge of your bed if possible, or sit up as far as you can in bed or on a chair. Hold the incentive spirometer in an upright position. Breathe out normally. Place the mouthpiece in your mouth and seal your lips tightly around it. Breathe in slowly and as deeply as possible, raising the piston or the ball toward the top of the column. Hold your breath for 3-5 seconds or for as long as possible. Allow the piston or ball to fall to the bottom of the column. Remove the mouthpiece from your mouth and breathe out normally. Rest for a few seconds and repeat Steps 1 through 7 at least 10 times every 1-2 hours when you are awake. Take your time and take a few normal breaths between deep breaths. The spirometer may include an indicator to show your best effort.  Use the indicator as a goal to work toward during each repetition. After each set of 10 deep breaths, practice coughing to be sure your lungs are clear. If you have an incision (the cut made at the time of surgery), support your incision when coughing by placing a pillow or rolled up towels firmly against it. Once you are able to get out of bed, walk around indoors and cough well. You may  stop using the incentive spirometer when instructed by your caregiver.  RISKS AND COMPLICATIONS Take your time so you do not get dizzy or light-headed. If you are in pain, you may need to take or ask for pain medication before doing incentive spirometry. It is harder to take a deep breath if you are having pain. AFTER USE Rest and breathe slowly and easily. It can be helpful to keep track of a log of your progress. Your caregiver can provide you with a simple table to help with this. If you are using the spirometer at home, follow these instructions: [place] IF:  You are having difficultly using the spirometer. You have trouble using the spirometer as often as instructed. Your pain medication is not giving enough relief while using the spirometer. You develop fever of 100.5 F (38.1 C) or higher. SEEK IMMEDIATE MEDICAL CARE IF:  You cough up bloody sputum that had not been present before. You develop fever of 102 F (38.9 C) or greater. You develop worsening pain at or near the incision site. MAKE SURE YOU:  Understand these instructions. Will watch your condition. Will get help right away if you are not doing well or get worse. Document Released: 07/07/2006 Document Revised: 05/19/2011 Document Reviewed: 09/07/2006 [facility] Patient Information 2014 [place], [place].   ________________________________________________________________________

## 2022-01-24 ENCOUNTER — Ambulatory Visit: Payer: Self-pay | Admitting: Orthopedic Surgery

## 2022-01-24 NOTE — H&P (Signed)
[name] is an 66 y.o. female.   Chief Complaint: left knee pain HPI: Visit For: Follow up Location: left; knee Duration: months Quality: aching; sharp; worsening Severity: moderate; pain level 8/10 Alleviating Factors: OTC medication (tylenol); narcotics (oxycodone that husband has) Aggravating Factors: ROM; weightbearing Associated Symptoms: swelling (at the end of the day) Previous Surgery: date: (06/04/21 left knee scope) Prior Imaging: x ray Previous Injections: did not help; last left knee injection was 09/16/21 and had no relief Also having shoulder pain  Past Medical History:  Diagnosis Date   Arthritis    arhtritis back and hands   Diabetes mellitus without complication (HCC)    GERD (gastroesophageal reflux disease)    Hypertension    Neuromuscular disorder (HCC)    nerve pain down right leg    Past Surgical History:  Procedure Laterality Date   BACK SURGERY     FOOT SURGERY Left    bunion with retained pin   LUMBAR LAMINECTOMY/DECOMPRESSION MICRODISCECTOMY Right 03/01/2014   Procedure: MICRO LUMBAR DECOMPRESSION L3 - L4 and L4 - L5 ON THE RIGHT  2 LEVELS;  Surgeon: Johnn Hai, MD;  Location: [facility];  Service: Orthopedics;  Laterality: Right;   LUMBAR LAMINECTOMY/DECOMPRESSION MICRODISCECTOMY Left 03/18/2017   Procedure: Revision Microlumbar decompression L5-S1 left;  Surgeon: Susa Day, MD;  Location: [facility];  Service: Orthopedics;  Laterality: Left;  120 mins   TONSILLECTOMY     TUBAL LIGATION      No family history on file. Social History:  reports that she has never smoked. She has never used smokeless tobacco. She reports current alcohol use. She reports that she does not use drugs.  Allergies: No Known Allergies  Current meds: benazepriL 10 mg-hydrochlorothiazide 12.5 mg tablet cyclobenzaprine 10 mg tablet latanoprost 0.005 % eye drops methIMAzole 5 mg tablet pantoprazole 40 mg tablet,delayed release rosuvastatin 10 mg  tablet Trulicity 7.74 JO/8.7 mL subcutaneous pen injector  Review of Systems  Constitutional: Negative.   HENT: Negative.    Eyes: Negative.   Respiratory: Negative.    Cardiovascular: Negative.   Gastrointestinal: Negative.   Endocrine: Negative.   Genitourinary: Negative.   Musculoskeletal:  Positive for arthralgias, gait problem, joint swelling and myalgias.  Skin: Negative.   Hematological: Negative.   Psychiatric/Behavioral: Negative.      There were no vitals taken for this visit. Physical Exam Constitutional:      Appearance: Normal appearance.  HENT:     Head: Normocephalic and atraumatic.     Right Ear: External ear normal.     Left Ear: External ear normal.     Nose: Nose normal.     Mouth/Throat:     Pharynx: Oropharynx is clear.  Eyes:     Conjunctiva/sclera: Conjunctivae normal.  Cardiovascular:     Rate and Rhythm: Normal rate.     Pulses: Normal pulses.     Heart sounds: Normal heart sounds.  Pulmonary:     Effort: Pulmonary effort is normal.     Breath sounds: Normal breath sounds.  Abdominal:     General: Bowel sounds are normal.  Musculoskeletal:     Cervical back: Normal range of motion.     Comments: Left knee tender and medial and lateral joint line. Patellofemoral pain compression. Ranges 0-1 40. No instability no DVT ipsilateral hip and ankle exam is unremarkable.  Skin:    General: Skin is warm and dry.  Neurological:     Mental Status: She is alert.    PA bent  knee standing demonstrates bone-on-bone arthrosis lateral compartment of the left knee.  Assessment/Plan Impression: Symptomatic osteoarthrosis of the left knee end-stage lateral compartment with a slight valgus deformity failing conservative treatment and operative treatment including arthroscopy debridement home exercise program   Plan:  We discussed a total knee replacement: I had an extensive discussion with the patient concerning their pathology and relevant anatomy and  current treatment options. We discussed living with the symptoms versus consideration of a total knee replacement. We mutually agreed to proceed with the latter. I discussed the risk and benefits including bleeding, infection, damage to neurovascular structures, DVT, PE, arthrofibrosis limiting postoperative motion, possible need for a manipulation, component failure, possible need for revision in the future. A surgical procedure as well as a complement to utilize was discussed as well.  In addition we discussed the hospital course as well as the postoperative treatment including 2-3 days in the hospital followed by 2 weeks for suture removal. Initiation of immediate physical therapy in the hospital and as an outpatient. The need for anticoagulation. The need for aggressive elevation to limit swelling. The recovery period to include potentially 3 months.  We will obtain preoperative clearance if necessary and proceed accordingly.  No history of DVT or PE. Preoperative clearance  Plan left total knee replacement  Cecilie Kicks, PA-C for Dr Tonita Cong 01/24/2022, 12:01 PM

## 2022-01-24 NOTE — H&P (View-Only) (Signed)
[name] is an 66 y.o. female.   Chief Complaint: left knee pain HPI: Visit For: Follow up Location: left; knee Duration: months Quality: aching; sharp; worsening Severity: moderate; pain level 8/10 Alleviating Factors: OTC medication (tylenol); narcotics (oxycodone that husband has) Aggravating Factors: ROM; weightbearing Associated Symptoms: swelling (at the end of the day) Previous Surgery: date: (06/04/21 left knee scope) Prior Imaging: x ray Previous Injections: did not help; last left knee injection was 09/16/21 and had no relief Also having shoulder pain  Past Medical History:  Diagnosis Date   Arthritis    arhtritis back and hands   Diabetes mellitus without complication (HCC)    GERD (gastroesophageal reflux disease)    Hypertension    Neuromuscular disorder (HCC)    nerve pain down right leg    Past Surgical History:  Procedure Laterality Date   BACK SURGERY     FOOT SURGERY Left    bunion with retained pin   LUMBAR LAMINECTOMY/DECOMPRESSION MICRODISCECTOMY Right 03/01/2014   Procedure: MICRO LUMBAR DECOMPRESSION L3 - L4 and L4 - L5 ON THE RIGHT  2 LEVELS;  Surgeon: Johnn Hai, MD;  Location: [facility];  Service: Orthopedics;  Laterality: Right;   LUMBAR LAMINECTOMY/DECOMPRESSION MICRODISCECTOMY Left 03/18/2017   Procedure: Revision Microlumbar decompression L5-S1 left;  Surgeon: Susa Day, MD;  Location: [facility];  Service: Orthopedics;  Laterality: Left;  120 mins   TONSILLECTOMY     TUBAL LIGATION      No family history on file. Social History:  reports that she has never smoked. She has never used smokeless tobacco. She reports current alcohol use. She reports that she does not use drugs.  Allergies: No Known Allergies  Current meds: benazepriL 10 mg-hydrochlorothiazide 12.5 mg tablet cyclobenzaprine 10 mg tablet latanoprost 0.005 % eye drops methIMAzole 5 mg tablet pantoprazole 40 mg tablet,delayed release rosuvastatin 10 mg  tablet Trulicity 3.81 RR/1.1 mL subcutaneous pen injector  Review of Systems  Constitutional: Negative.   HENT: Negative.    Eyes: Negative.   Respiratory: Negative.    Cardiovascular: Negative.   Gastrointestinal: Negative.   Endocrine: Negative.   Genitourinary: Negative.   Musculoskeletal:  Positive for arthralgias, gait problem, joint swelling and myalgias.  Skin: Negative.   Hematological: Negative.   Psychiatric/Behavioral: Negative.      There were no vitals taken for this visit. Physical Exam Constitutional:      Appearance: Normal appearance.  HENT:     Head: Normocephalic and atraumatic.     Right Ear: External ear normal.     Left Ear: External ear normal.     Nose: Nose normal.     Mouth/Throat:     Pharynx: Oropharynx is clear.  Eyes:     Conjunctiva/sclera: Conjunctivae normal.  Cardiovascular:     Rate and Rhythm: Normal rate.     Pulses: Normal pulses.     Heart sounds: Normal heart sounds.  Pulmonary:     Effort: Pulmonary effort is normal.     Breath sounds: Normal breath sounds.  Abdominal:     General: Bowel sounds are normal.  Musculoskeletal:     Cervical back: Normal range of motion.     Comments: Left knee tender and medial and lateral joint line. Patellofemoral pain compression. Ranges 0-1 40. No instability no DVT ipsilateral hip and ankle exam is unremarkable.  Skin:    General: Skin is warm and dry.  Neurological:     Mental Status: She is alert.    PA bent  knee standing demonstrates bone-on-bone arthrosis lateral compartment of the left knee.  Assessment/Plan Impression: Symptomatic osteoarthrosis of the left knee end-stage lateral compartment with a slight valgus deformity failing conservative treatment and operative treatment including arthroscopy debridement home exercise program   Plan:  We discussed a total knee replacement: I had an extensive discussion with the patient concerning their pathology and relevant anatomy and  current treatment options. We discussed living with the symptoms versus consideration of a total knee replacement. We mutually agreed to proceed with the latter. I discussed the risk and benefits including bleeding, infection, damage to neurovascular structures, DVT, PE, arthrofibrosis limiting postoperative motion, possible need for a manipulation, component failure, possible need for revision in the future. A surgical procedure as well as a complement to utilize was discussed as well.  In addition we discussed the hospital course as well as the postoperative treatment including 2-3 days in the hospital followed by 2 weeks for suture removal. Initiation of immediate physical therapy in the hospital and as an outpatient. The need for anticoagulation. The need for aggressive elevation to limit swelling. The recovery period to include potentially 3 months.  We will obtain preoperative clearance if necessary and proceed accordingly.  No history of DVT or PE. Preoperative clearance  Plan left total knee replacement  Cecilie Kicks, PA-C for Dr Tonita Cong 01/24/2022, 12:01 PM

## 2022-01-28 ENCOUNTER — Encounter (HOSPITAL_COMMUNITY): Payer: Self-pay

## 2022-01-28 ENCOUNTER — Encounter (HOSPITAL_COMMUNITY)
Admission: RE | Admit: 2022-01-28 | Discharge: 2022-01-28 | Disposition: A | Discharge status: Home / Self Care | Payer: Medicare HMO | Source: Ambulatory Visit | Attending: Anesthesiology | Admitting: Anesthesiology

## 2022-01-28 ENCOUNTER — Other Ambulatory Visit: Payer: Self-pay

## 2022-01-28 DIAGNOSIS — I4510 Unspecified right bundle-branch block: Secondary | ICD-10-CM | POA: Insufficient documentation

## 2022-01-28 DIAGNOSIS — E119 Type 2 diabetes mellitus without complications: Secondary | ICD-10-CM | POA: Diagnosis not present

## 2022-01-28 DIAGNOSIS — Z01818 Encounter for other preprocedural examination: Secondary | ICD-10-CM | POA: Insufficient documentation

## 2022-01-28 HISTORY — DX: Sleep related leg cramps: G47.62

## 2022-01-28 LAB — CBC
HCT: 40.8 % (ref 36.0–46.0)
Hemoglobin: 13.4 g/dL (ref 12.0–15.0)
MCH: 31.9 pg (ref 26.0–34.0)
MCHC: 32.8 g/dL (ref 30.0–36.0)
MCV: 97.1 fL (ref 80.0–100.0)
Platelets: 271 10*3/uL (ref 150–400)
RBC: 4.2 MIL/uL (ref 3.87–5.11)
RDW: 12.5 % (ref 11.5–15.5)
WBC: 8 10*3/uL (ref 4.0–10.5)
nRBC: 0 % (ref 0.0–0.2)

## 2022-01-28 LAB — BASIC METABOLIC PANEL
Anion gap: 7 (ref 5–15)
BUN: 22 mg/dL (ref 8–23)
CO2: 26 mmol/L (ref 22–32)
Calcium: 9.7 mg/dL (ref 8.9–10.3)
Chloride: 104 mmol/L (ref 98–111)
Creatinine, Ser: 0.8 mg/dL (ref 0.44–1.00)
GFR, Estimated: >60 mL/min (ref >60)
Glucose, Bld: 98 mg/dL (ref 70–99)
Potassium: 4.4 mmol/L (ref 3.5–5.1)
Sodium: 137 mmol/L (ref 135–145)

## 2022-01-28 LAB — SURGICAL PCR SCREEN
MRSA, PCR: NEGATIVE
Staphylococcus aureus: NEGATIVE

## 2022-01-28 LAB — HEMOGLOBIN A1C
Hgb A1c MFr Bld: 5.9 % — ABNORMAL HIGH (ref 4.8–5.6)
Mean Plasma Glucose: 122.63 mg/dL

## 2022-01-28 LAB — GLUCOSE, CAPILLARY: Glucose-Capillary: 105 mg/dL — ABNORMAL HIGH (ref 70–99)

## 2022-01-28 NOTE — Progress Notes (Signed)
Anesthesia note:  Bowel prep reminder:  NA  PCP - Sammuel Hines PA Cardiologist -none Other-   Chest x-ray - no EKG - 01/28/22-chart Stress Test - no ECHO - no Cardiac Cath - no CABG-no Pacemaker/ICD device last checked:NA  Sleep Study - no CPAP -    CBG at PAT visit-105 Fasting Blood Sugar at home-100-115 Checks Blood Sugar _every 1-2 months____  Blood Thinner:no Blood Thinner Instructions: Aspirin Instructions: Last Dose:  Anesthesia review: /No    Patient denies shortness of breath, fever, cough and chest pain at PAT appointment. Pt has no SOB with activities   Patient verbalized understanding of instructions that were given to them at the PAT appointment. Patient was also instructed that they will need to review over the PAT instructions again at home before surgery.yes her significant other was with her at the PAT visit

## 2022-02-04 NOTE — Anesthesia Preprocedure Evaluation (Signed)
Anesthesia Evaluation  Patient identified by MRN, date of birth, ID band Patient awake    Reviewed: Allergy & Precautions, H&P , NPO status , Patient's Chart, lab work & pertinent test results  Airway Mallampati: II  TM Distance: >3 FB Neck ROM: Full    Dental no notable dental hx.    Pulmonary neg pulmonary ROS   Pulmonary exam normal breath sounds clear to auscultation       Cardiovascular Exercise Tolerance: Good hypertension, Pt. on medications Normal cardiovascular exam Rhythm:Regular Rate:Normal     Neuro/Psych negative neurological ROS  negative psych ROS   GI/Hepatic Neg liver ROS,GERD  Medicated,,  Endo/Other  diabetes, Type 2    Renal/GU negative Renal ROS  negative genitourinary   Musculoskeletal negative musculoskeletal ROS (+) Arthritis , Osteoarthritis,    Abdominal   Peds negative pediatric ROS (+)  Hematology negative hematology ROS (+)   Anesthesia Other Findings   Reproductive/Obstetrics negative OB ROS                             Anesthesia Physical Anesthesia Plan  ASA: 2  Anesthesia Plan: Spinal   Post-op Pain Management: Regional block* and Tylenol PO (pre-op)*   Induction: Intravenous  PONV Risk Score and Plan: 3 and Dexamethasone and Propofol infusion  Airway Management Planned: Natural Airway and Simple Face Mask  Additional Equipment:   Intra-op Plan:   Post-operative Plan:   Informed Consent: I have reviewed the patients History and Physical, chart, labs and discussed the procedure including the risks, benefits and alternatives for the proposed anesthesia with the patient or authorized representative who has indicated his/her understanding and acceptance.     Dental advisory given  Plan Discussed with: CRNA and Surgeon  Anesthesia Plan Comments:        Anesthesia Quick Evaluation

## 2022-02-05 ENCOUNTER — Ambulatory Visit (HOSPITAL_BASED_OUTPATIENT_CLINIC_OR_DEPARTMENT_OTHER): Payer: Medicare HMO | Admitting: Anesthesiology

## 2022-02-05 ENCOUNTER — Observation Stay (HOSPITAL_COMMUNITY): Payer: Medicare HMO

## 2022-02-05 ENCOUNTER — Other Ambulatory Visit: Payer: Self-pay

## 2022-02-05 ENCOUNTER — Ambulatory Visit (HOSPITAL_COMMUNITY): Payer: Medicare HMO | Admitting: Anesthesiology

## 2022-02-05 ENCOUNTER — Encounter (HOSPITAL_COMMUNITY)
Admission: RE | Disposition: A | Discharge status: Home / Self Care | Payer: Self-pay | Source: Ambulatory Visit | Attending: Specialist

## 2022-02-05 ENCOUNTER — Encounter (HOSPITAL_COMMUNITY): Payer: Self-pay | Admitting: Specialist

## 2022-02-05 ENCOUNTER — Observation Stay (HOSPITAL_COMMUNITY)
Admission: RE | Admit: 2022-02-05 | Discharge: 2022-02-07 | Disposition: A | Discharge status: Home / Self Care | Payer: Medicare HMO | Source: Ambulatory Visit | Attending: Specialist | Admitting: Specialist

## 2022-02-05 DIAGNOSIS — T8182XA Emphysema (subcutaneous) resulting from a procedure, initial encounter: Secondary | ICD-10-CM | POA: Diagnosis not present

## 2022-02-05 DIAGNOSIS — I10 Essential (primary) hypertension: Secondary | ICD-10-CM | POA: Insufficient documentation

## 2022-02-05 DIAGNOSIS — M1712 Unilateral primary osteoarthritis, left knee: Secondary | ICD-10-CM | POA: Diagnosis not present

## 2022-02-05 DIAGNOSIS — M21062 Valgus deformity, not elsewhere classified, left knee: Secondary | ICD-10-CM

## 2022-02-05 DIAGNOSIS — Z96652 Presence of left artificial knee joint: Secondary | ICD-10-CM | POA: Diagnosis not present

## 2022-02-05 DIAGNOSIS — Z79899 Other long term (current) drug therapy: Secondary | ICD-10-CM | POA: Insufficient documentation

## 2022-02-05 DIAGNOSIS — Z01818 Encounter for other preprocedural examination: Secondary | ICD-10-CM

## 2022-02-05 DIAGNOSIS — E119 Type 2 diabetes mellitus without complications: Secondary | ICD-10-CM | POA: Diagnosis not present

## 2022-02-05 DIAGNOSIS — Z471 Aftercare following joint replacement surgery: Secondary | ICD-10-CM | POA: Diagnosis not present

## 2022-02-05 DIAGNOSIS — G8918 Other acute postprocedural pain: Secondary | ICD-10-CM | POA: Diagnosis not present

## 2022-02-05 HISTORY — PX: TOTAL KNEE ARTHROPLASTY: SHX125

## 2022-02-05 LAB — GLUCOSE, CAPILLARY
Glucose-Capillary: 118 mg/dL — ABNORMAL HIGH (ref 70–99)
Glucose-Capillary: 117 mg/dL — ABNORMAL HIGH (ref 70–99)
Glucose-Capillary: 221 mg/dL — ABNORMAL HIGH (ref 70–99)
Glucose-Capillary: 140 mg/dL — ABNORMAL HIGH (ref 70–99)

## 2022-02-05 SURGERY — ARTHROPLASTY, KNEE, TOTAL
Anesthesia: Spinal | Site: Knee | Laterality: Left

## 2022-02-05 MED ORDER — BISACODYL 5 MG PO TBEC: 5.0000 mg | DELAYED_RELEASE_TABLET | Freq: Every day | ORAL | Status: DC | PRN

## 2022-02-05 MED ORDER — OXYCODONE HCL 5 MG PO TABS: 5.0000 mg | ORAL_TABLET | ORAL | 0 refills | Status: DC | PRN

## 2022-02-05 MED ORDER — PROPOFOL 500 MG/50ML IV EMUL
INTRAVENOUS | Status: DC | PRN
  Administered 2022-02-05: 50 ug/kg/min via INTRAVENOUS

## 2022-02-05 MED ORDER — ASPIRIN 81 MG PO TBEC: 81.0000 mg | DELAYED_RELEASE_TABLET | Freq: Two times a day (BID) | ORAL | 1 refills | Status: DC

## 2022-02-05 MED ORDER — CEFAZOLIN SODIUM-DEXTROSE 2-4 GM/100ML-% IV SOLN
2.0000 g | INTRAVENOUS | Status: AC
  Administered 2022-02-05: 2 g via INTRAVENOUS
  Filled 2022-02-05: qty 100

## 2022-02-05 MED ORDER — STERILE WATER FOR IRRIGATION IR SOLN
Status: DC | PRN
  Administered 2022-02-05: 2000 mL

## 2022-02-05 MED ORDER — ACETAMINOPHEN 500 MG PO TABS
1000.0000 mg | ORAL_TABLET | Freq: Four times a day (QID) | ORAL | Status: AC
  Administered 2022-02-05 – 2022-02-06 (×4): 1000 mg via ORAL
  Filled 2022-02-05 (×3): qty 2

## 2022-02-05 MED ORDER — DEXAMETHASONE SODIUM PHOSPHATE 10 MG/ML IJ SOLN
INTRAMUSCULAR | Status: DC | PRN
  Administered 2022-02-05: 5 mg via INTRAVENOUS

## 2022-02-05 MED ORDER — 0.9 % SODIUM CHLORIDE (POUR BTL) OPTIME
TOPICAL | Status: DC | PRN
  Administered 2022-02-05: 1000 mL

## 2022-02-05 MED ORDER — HYDROMORPHONE HCL 1 MG/ML IJ SOLN: 0.2500 mg | INTRAMUSCULAR | Status: DC | PRN

## 2022-02-05 MED ORDER — MENTHOL 3 MG MT LOZG: 1.0000 | LOZENGE | OROMUCOSAL | Status: DC | PRN

## 2022-02-05 MED ORDER — HYDROMORPHONE HCL 1 MG/ML IJ SOLN
0.5000 mg | INTRAMUSCULAR | Status: DC | PRN
  Administered 2022-02-05 – 2022-02-06 (×4): 1 mg via INTRAVENOUS
  Filled 2022-02-05 (×3): qty 1

## 2022-02-05 MED ORDER — INSULIN ASPART 100 UNIT/ML IJ SOLN
0.0000 [IU] | Freq: Three times a day (TID) | INTRAMUSCULAR | Status: DC
  Administered 2022-02-05: 5 [IU] via SUBCUTANEOUS
  Administered 2022-02-06: 2 [IU] via SUBCUTANEOUS
  Administered 2022-02-06 – 2022-02-07 (×2): 3 [IU] via SUBCUTANEOUS

## 2022-02-05 MED ORDER — ACETAMINOPHEN 500 MG PO TABS
ORAL_TABLET | ORAL | Status: AC
  Filled 2022-02-05: qty 2

## 2022-02-05 MED ORDER — OXYCODONE HCL 5 MG PO TABS
5.0000 mg | ORAL_TABLET | ORAL | Status: DC | PRN
  Administered 2022-02-05: 5 mg via ORAL

## 2022-02-05 MED ORDER — PHENYLEPHRINE HCL-NACL 20-0.9 MG/250ML-% IV SOLN
INTRAVENOUS | Status: DC | PRN
  Administered 2022-02-05: 50 ug/min via INTRAVENOUS

## 2022-02-05 MED ORDER — PROPOFOL 1000 MG/100ML IV EMUL
INTRAVENOUS | Status: AC
  Filled 2022-02-05: qty 100

## 2022-02-05 MED ORDER — METOCLOPRAMIDE HCL 5 MG PO TABS: 5.0000 mg | ORAL_TABLET | Freq: Three times a day (TID) | ORAL | Status: DC | PRN

## 2022-02-05 MED ORDER — FENTANYL CITRATE (PF) 100 MCG/2ML IJ SOLN
INTRAMUSCULAR | Status: DC | PRN
  Administered 2022-02-05 (×2): 50 ug via INTRAVENOUS

## 2022-02-05 MED ORDER — OXYCODONE HCL 5 MG PO TABS
10.0000 mg | ORAL_TABLET | ORAL | Status: DC | PRN
  Administered 2022-02-05 – 2022-02-06 (×6): 10 mg via ORAL
  Filled 2022-02-05 (×6): qty 2

## 2022-02-05 MED ORDER — LACTATED RINGERS IV SOLN: INTRAVENOUS | Status: DC

## 2022-02-05 MED ORDER — METHOCARBAMOL 500 MG PO TABS
500.0000 mg | ORAL_TABLET | Freq: Four times a day (QID) | ORAL | Status: DC | PRN
  Administered 2022-02-05 – 2022-02-06 (×4): 500 mg via ORAL
  Filled 2022-02-05 (×5): qty 1

## 2022-02-05 MED ORDER — PANTOPRAZOLE SODIUM 40 MG PO TBEC
40.0000 mg | DELAYED_RELEASE_TABLET | Freq: Every evening | ORAL | Status: DC
  Administered 2022-02-05 – 2022-02-06 (×2): 40 mg via ORAL
  Filled 2022-02-05 (×2): qty 1

## 2022-02-05 MED ORDER — BUPIVACAINE LIPOSOME 1.3 % IJ SUSP
INTRAMUSCULAR | Status: AC
  Filled 2022-02-05: qty 20

## 2022-02-05 MED ORDER — SODIUM CHLORIDE 0.9 % IR SOLN
Status: DC | PRN
  Administered 2022-02-05: 1000 mL

## 2022-02-05 MED ORDER — SODIUM CHLORIDE (PF) 0.9 % IJ SOLN
INTRAMUSCULAR | Status: AC
  Filled 2022-02-05: qty 50

## 2022-02-05 MED ORDER — METHOCARBAMOL 500 MG IVPB - SIMPLE MED
500.0000 mg | Freq: Four times a day (QID) | INTRAVENOUS | Status: DC | PRN
  Administered 2022-02-05: 500 mg via INTRAVENOUS

## 2022-02-05 MED ORDER — BENAZEPRIL-HYDROCHLOROTHIAZIDE 10-12.5 MG PO TABS: 1.0000 | ORAL_TABLET | Freq: Every morning | ORAL | Status: DC

## 2022-02-05 MED ORDER — BUPIVACAINE IN DEXTROSE 0.75-8.25 % IT SOLN
INTRATHECAL | Status: DC | PRN
  Administered 2022-02-05: 1.8 mL via INTRATHECAL

## 2022-02-05 MED ORDER — ALUM & MAG HYDROXIDE-SIMETH 200-200-20 MG/5ML PO SUSP: 30.0000 mL | ORAL | Status: DC | PRN

## 2022-02-05 MED ORDER — METHOCARBAMOL 500 MG IVPB - SIMPLE MED
INTRAVENOUS | Status: AC
  Filled 2022-02-05: qty 55

## 2022-02-05 MED ORDER — RISAQUAD PO CAPS
1.0000 | ORAL_CAPSULE | Freq: Every day | ORAL | Status: DC
  Administered 2022-02-05 – 2022-02-07 (×3): 1 via ORAL
  Filled 2022-02-05 (×3): qty 1

## 2022-02-05 MED ORDER — ROPIVACAINE HCL 5 MG/ML IJ SOLN
INTRAMUSCULAR | Status: DC | PRN
  Administered 2022-02-05: 20 mL via PERINEURAL

## 2022-02-05 MED ORDER — ONDANSETRON HCL 4 MG/2ML IJ SOLN: 4.0000 mg | Freq: Four times a day (QID) | INTRAMUSCULAR | Status: DC | PRN

## 2022-02-05 MED ORDER — HYDROMORPHONE HCL 1 MG/ML IJ SOLN
INTRAMUSCULAR | Status: AC
  Filled 2022-02-05: qty 1

## 2022-02-05 MED ORDER — TRANEXAMIC ACID-NACL 1000-0.7 MG/100ML-% IV SOLN
1000.0000 mg | INTRAVENOUS | Status: AC
  Administered 2022-02-05: 1000 mg via INTRAVENOUS
  Filled 2022-02-05: qty 100

## 2022-02-05 MED ORDER — MIDAZOLAM HCL 2 MG/2ML IJ SOLN
INTRAMUSCULAR | Status: AC
  Filled 2022-02-05: qty 2

## 2022-02-05 MED ORDER — POLYETHYLENE GLYCOL 3350 17 G PO PACK: 17.0000 g | PACK | Freq: Every day | ORAL | 0 refills | Status: DC

## 2022-02-05 MED ORDER — DEXAMETHASONE SODIUM PHOSPHATE 10 MG/ML IJ SOLN
INTRAMUSCULAR | Status: AC
  Filled 2022-02-05: qty 1

## 2022-02-05 MED ORDER — BUPIVACAINE LIPOSOME 1.3 % IJ SUSP
INTRAMUSCULAR | Status: DC | PRN
  Administered 2022-02-05: 10 mL

## 2022-02-05 MED ORDER — METOCLOPRAMIDE HCL 5 MG/ML IJ SOLN: 5.0000 mg | Freq: Three times a day (TID) | INTRAMUSCULAR | Status: DC | PRN

## 2022-02-05 MED ORDER — PHENOL 1.4 % MT LIQD: 1.0000 | OROMUCOSAL | Status: DC | PRN

## 2022-02-05 MED ORDER — ORAL CARE MOUTH RINSE: 15.0000 mL | Freq: Once | OROMUCOSAL | Status: AC

## 2022-02-05 MED ORDER — ONDANSETRON HCL 4 MG PO TABS: 4.0000 mg | ORAL_TABLET | Freq: Four times a day (QID) | ORAL | Status: DC | PRN

## 2022-02-05 MED ORDER — BUPIVACAINE HCL (PF) 0.25 % IJ SOLN
INTRAMUSCULAR | Status: AC
  Filled 2022-02-05: qty 30

## 2022-02-05 MED ORDER — ACETAMINOPHEN 325 MG PO TABS: 325.0000 mg | ORAL_TABLET | Freq: Four times a day (QID) | ORAL | Status: DC | PRN

## 2022-02-05 MED ORDER — FENTANYL CITRATE (PF) 100 MCG/2ML IJ SOLN
INTRAMUSCULAR | Status: AC
  Filled 2022-02-05: qty 2

## 2022-02-05 MED ORDER — DOCUSATE SODIUM 100 MG PO CAPS: 100.0000 mg | ORAL_CAPSULE | Freq: Two times a day (BID) | ORAL | 1 refills | Status: AC | PRN

## 2022-02-05 MED ORDER — BENAZEPRIL HCL 10 MG PO TABS
10.0000 mg | ORAL_TABLET | Freq: Every day | ORAL | Status: DC
  Administered 2022-02-06 – 2022-02-07 (×2): 10 mg via ORAL
  Filled 2022-02-05: qty 1
  Filled 2022-02-05: qty 0.5

## 2022-02-05 MED ORDER — LATANOPROST 0.005 % OP SOLN
1.0000 [drp] | Freq: Every day | OPHTHALMIC | Status: DC
  Administered 2022-02-05 – 2022-02-06 (×2): 1 [drp] via OPHTHALMIC
  Filled 2022-02-05: qty 2.5

## 2022-02-05 MED ORDER — HYDROCHLOROTHIAZIDE 12.5 MG PO TABS
12.5000 mg | ORAL_TABLET | Freq: Every day | ORAL | Status: DC
  Administered 2022-02-06 – 2022-02-07 (×2): 12.5 mg via ORAL
  Filled 2022-02-05 (×2): qty 1

## 2022-02-05 MED ORDER — PROPOFOL 10 MG/ML IV BOLUS
INTRAVENOUS | Status: DC | PRN
  Administered 2022-02-05: 10 mg via INTRAVENOUS

## 2022-02-05 MED ORDER — CHLORHEXIDINE GLUCONATE 0.12 % MT SOLN
15.0000 mL | Freq: Once | OROMUCOSAL | Status: AC
  Administered 2022-02-05: 15 mL via OROMUCOSAL

## 2022-02-05 MED ORDER — ACETAMINOPHEN 325 MG PO TABS
ORAL_TABLET | ORAL | Status: AC
  Filled 2022-02-05: qty 2

## 2022-02-05 MED ORDER — SODIUM CHLORIDE 0.9% FLUSH
INTRAVENOUS | Status: DC | PRN
  Administered 2022-02-05: 20 mL via INTRAVENOUS

## 2022-02-05 MED ORDER — DOCUSATE SODIUM 100 MG PO CAPS
100.0000 mg | ORAL_CAPSULE | Freq: Two times a day (BID) | ORAL | Status: DC
  Administered 2022-02-05 – 2022-02-07 (×4): 100 mg via ORAL
  Filled 2022-02-05 (×4): qty 1

## 2022-02-05 MED ORDER — OXYCODONE HCL 5 MG PO TABS
ORAL_TABLET | ORAL | Status: AC
  Filled 2022-02-05: qty 1

## 2022-02-05 MED ORDER — CEFAZOLIN SODIUM-DEXTROSE 1-4 GM/50ML-% IV SOLN
1.0000 g | Freq: Four times a day (QID) | INTRAVENOUS | Status: AC
  Administered 2022-02-05 (×2): 1 g via INTRAVENOUS
  Filled 2022-02-05 (×2): qty 50

## 2022-02-05 MED ORDER — MIDAZOLAM HCL 5 MG/5ML IJ SOLN
INTRAMUSCULAR | Status: DC | PRN
  Administered 2022-02-05: 2 mg via INTRAVENOUS

## 2022-02-05 MED ORDER — ACETAMINOPHEN 10 MG/ML IV SOLN
1000.0000 mg | INTRAVENOUS | Status: AC
  Administered 2022-02-05: 1000 mg via INTRAVENOUS
  Filled 2022-02-05: qty 100

## 2022-02-05 MED ORDER — DIPHENHYDRAMINE HCL 12.5 MG/5ML PO ELIX: 12.5000 mg | ORAL_SOLUTION | ORAL | Status: DC | PRN

## 2022-02-05 MED ORDER — ASPIRIN 81 MG PO CHEW
81.0000 mg | CHEWABLE_TABLET | Freq: Two times a day (BID) | ORAL | Status: DC
  Administered 2022-02-06 – 2022-02-07 (×3): 81 mg via ORAL
  Filled 2022-02-05 (×3): qty 1

## 2022-02-05 MED ORDER — POTASSIUM CHLORIDE IN NACL 20-0.45 MEQ/L-% IV SOLN
INTRAVENOUS | Status: AC
  Filled 2022-02-05 (×2): qty 1000

## 2022-02-05 MED ORDER — MAGNESIUM CITRATE PO SOLN: 1.0000 | Freq: Once | ORAL | Status: DC | PRN

## 2022-02-05 MED ORDER — POLYETHYLENE GLYCOL 3350 17 G PO PACK: 17.0000 g | PACK | Freq: Every day | ORAL | Status: DC | PRN

## 2022-02-05 SURGICAL SUPPLY — 67 items
ATTUNE MED DOME PAT 32 KNEE (Knees) IMPLANT
ATTUNE PS FEM LT SZ 4 CEM KNEE (Femur) IMPLANT
ATTUNE PSRP INSR SZ4 5 KNEE (Insert) IMPLANT
BAG COUNTER SPONGE SURGICOUNT (BAG) IMPLANT
BAG DECANTER FOR FLEXI CONT (MISCELLANEOUS) ×1 IMPLANT
BAG ZIPLOCK 12X15 (MISCELLANEOUS) IMPLANT
BASEPLATE TIBIAL ROTATING SZ 4 (Knees) IMPLANT
BLADE SAW SGTL 11.0X1.19X90.0M (BLADE) ×1 IMPLANT
BLADE SAW SGTL 13.0X1.19X90.0M (BLADE) ×1 IMPLANT
BLADE SURG SZ10 CARB STEEL (BLADE) ×2 IMPLANT
BNDG ELASTIC 4X5.8 VLCR STR LF (GAUZE/BANDAGES/DRESSINGS) ×1 IMPLANT
BNDG ELASTIC 6X5.8 VLCR STR LF (GAUZE/BANDAGES/DRESSINGS) ×1 IMPLANT
BOWL SMART MIX CTS (DISPOSABLE) ×1 IMPLANT
CEMENT HV SMART SET (Cement) ×2 IMPLANT
COVER SURGICAL LIGHT HANDLE (MISCELLANEOUS) ×1 IMPLANT
CUFF TOURN SGL QUICK 34 (TOURNIQUET CUFF) ×1
CUFF TRNQT CYL 34X4.125X (TOURNIQUET CUFF) ×1 IMPLANT
DRAPE INCISE IOBAN 66X45 STRL (DRAPES) IMPLANT
DRAPE ORTHO SPLIT 77X108 STRL (DRAPES) ×2
DRAPE SHEET LG 3/4 BI-LAMINATE (DRAPES) ×1 IMPLANT
DRAPE SURG ORHT 6 SPLT 77X108 (DRAPES) ×2 IMPLANT
DRAPE U-SHAPE 47X51 STRL (DRAPES) ×1 IMPLANT
DRSG AQUACEL AG ADV 3.5X10 (GAUZE/BANDAGES/DRESSINGS) ×1 IMPLANT
DRSG TEGADERM 4X4.75 (GAUZE/BANDAGES/DRESSINGS) IMPLANT
DURAPREP 26ML APPLICATOR (WOUND CARE) ×1 IMPLANT
ELECT BLADE TIP CTD 4 INCH (ELECTRODE) IMPLANT
ELECT REM PT RETURN 15FT ADLT (MISCELLANEOUS) ×1 IMPLANT
EVACUATOR 1/8 PVC DRAIN (DRAIN) IMPLANT
GAUZE SPONGE 2X2 8PLY STRL LF (GAUZE/BANDAGES/DRESSINGS) IMPLANT
GLOVE BIO SURGEON STRL SZ7 (GLOVE) ×1 IMPLANT
GLOVE BIOGEL PI IND STRL 7.0 (GLOVE) ×1 IMPLANT
GLOVE BIOGEL PI IND STRL 8 (GLOVE) ×1 IMPLANT
GLOVE SURG SS PI 8.0 STRL IVOR (GLOVE) ×1 IMPLANT
GOWN STRL REUS W/ TWL XL LVL3 (GOWN DISPOSABLE) ×2 IMPLANT
GOWN STRL REUS W/TWL XL LVL3 (GOWN DISPOSABLE) ×2
HANDPIECE INTERPULSE COAX TIP (DISPOSABLE) ×1
HEMOSTAT SPONGE AVITENE ULTRA (HEMOSTASIS) IMPLANT
HOLDER FOLEY CATH W/STRAP (MISCELLANEOUS) IMPLANT
IMMOBILIZER KNEE 20 (SOFTGOODS) ×1
IMMOBILIZER KNEE 20 THIGH 36 (SOFTGOODS) ×1 IMPLANT
KIT TURNOVER KIT A (KITS) IMPLANT
MANIFOLD NEPTUNE II (INSTRUMENTS) ×1 IMPLANT
NS IRRIG 1000ML POUR BTL (IV SOLUTION) IMPLANT
PACK TOTAL KNEE CUSTOM (KITS) ×1 IMPLANT
PIN STEINMAN FIXATION KNEE (PIN) IMPLANT
PROTECTOR NERVE ULNAR (MISCELLANEOUS) ×1 IMPLANT
SAW OSC TIP CART 19.5X105X1.3 (SAW) IMPLANT
SEALER BIPOLAR AQUA 6.0 (INSTRUMENTS) IMPLANT
SET HNDPC FAN SPRY TIP SCT (DISPOSABLE) ×1 IMPLANT
SOLUTION PRONTOSAN WOUND 350ML (IRRIGATION / IRRIGATOR) ×1 IMPLANT
SPIKE FLUID TRANSFER (MISCELLANEOUS) ×1 IMPLANT
STAPLER VISISTAT (STAPLE) IMPLANT
STRIP CLOSURE SKIN 1/2X4 (GAUZE/BANDAGES/DRESSINGS) IMPLANT
SUT BONE WAX W31G (SUTURE) ×1 IMPLANT
SUT MNCRL AB 4-0 PS2 18 (SUTURE) IMPLANT
SUT STRATAFIX 0 PDS 27 VIOLET (SUTURE) ×1
SUT VIC AB 1 CT1 27 (SUTURE) ×4
SUT VIC AB 1 CT1 27XBRD ANTBC (SUTURE) ×3 IMPLANT
SUT VIC AB 2-0 CT1 27 (SUTURE) ×3
SUT VIC AB 2-0 CT1 TAPERPNT 27 (SUTURE) ×3 IMPLANT
SUTURE STRATFX 0 PDS 27 VIOLET (SUTURE) ×1 IMPLANT
SYR 3ML LL SCALE MARK (SYRINGE) IMPLANT
TAPE STRIPS DRAPE STRL (GAUZE/BANDAGES/DRESSINGS) IMPLANT
TRAY FOLEY MTR SLVR 16FR STAT (SET/KITS/TRAYS/PACK) ×1 IMPLANT
WATER STERILE IRR 1000ML POUR (IV SOLUTION) ×1 IMPLANT
WIPE CHG 2% 2PK PREOPERATIVE (MISCELLANEOUS) ×1 IMPLANT
WRAP KNEE MAXI GEL POST OP (GAUZE/BANDAGES/DRESSINGS) ×1 IMPLANT

## 2022-02-05 NOTE — Anesthesia Procedure Notes (Signed)
Spinal  Patient location during procedure: OR Start time: 02/05/2022 7:32 AM End time: 02/05/2022 7:34 AM Reason for block: surgical anesthesia Staffing Performed: resident/CRNA  Anesthesiologist: Myrtie Soman, MD Resident/CRNA: Lind Covert, CRNA Performed by: Lind Covert, CRNA Authorized by: Myrtie Soman, MD   Preanesthetic Checklist Completed: patient identified, IV checked, site marked, risks and benefits discussed, surgical consent, monitors and equipment checked, pre-op evaluation and timeout performed Spinal Block Patient position: sitting Prep: DuraPrep Patient monitoring: heart rate, cardiac monitor, continuous pulse ox and blood pressure Approach: midline Location: L2-3 Injection technique: single-shot Needle Needle type: Pencan  Needle gauge: 24 G Needle length: 10 cm Needle insertion depth: 7 cm Assessment Sensory level: T6 Events: CSF return Additional Notes Timeout performed. Patient in sitting position. L 2-3 identified. Site cleansed with Duraprep. SAB without difficulty. Patient to supine position.

## 2022-02-05 NOTE — Anesthesia Postprocedure Evaluation (Signed)
Anesthesia Post Note  Patient: [name]  Procedure(s) Performed: TOTAL KNEE ARTHROPLASTY (Left: Knee)     Patient location during evaluation: PACU Anesthesia Type: Spinal Level of consciousness: oriented and awake and alert Pain management: pain level controlled Vital Signs Assessment: post-procedure vital signs reviewed and stable Respiratory status: spontaneous breathing, respiratory function stable and patient connected to nasal cannula oxygen Cardiovascular status: blood pressure returned to baseline and stable Postop Assessment: no headache, no backache and no apparent nausea or vomiting Anesthetic complications: no  No notable events documented.  Last Vitals:  Vitals:   02/05/22 1045 02/05/22 1100  BP: (!) 148/81 139/72  Pulse: 70 72  Resp: 14 18  Temp: (!) 36.4 C   SpO2: 100% 99%    Last Pain:  Vitals:   02/05/22 1110  TempSrc:   PainSc: 8                  Rohini Jaroszewski S

## 2022-02-05 NOTE — Evaluation (Signed)
Physical Therapy Evaluation Patient Details Name: [name] MRN: [id number] DOB: Feb 05, 1956 Today's Date: 02/05/2022  History of Present Illness  66 yo female s/p LTKA 02/05/22, PMH: back surgery, DM  Clinical Impression  Pt admitted with above diagnosis.  Pt currently with functional limitations due to the deficits listed below (see PT Problem List). Pt will benefit from skilled PT to increase their independence and safety with mobility to allow discharge to the venue listed below.     The patient  is reporting 8/10 pain. Removed Ice pack and KI and assisted to sitting on bed edge and worked on knee flexion. Patient reports a little relief. Declined standing due to reports of pain. Assisted back into bed, repositioned and place ice packs on left knee. Patient should progress to Dc home.     Recommendations for follow up therapy are one component of a multi-disciplinary discharge planning process, led by the attending physician.  Recommendations may be updated based on patient status, additional functional criteria and insurance authorization.  Follow Up Recommendations Follow physician's recommendations for discharge plan and follow up therapies      Assistance Recommended at Discharge Set up Supervision/Assistance  Patient can return home with the following  A little help with walking and/or transfers;A little help with bathing/dressing/bathroom;Assistance with cooking/housework;Assist for transportation    Equipment Recommendations None recommended by PT  Recommendations for Other Services       Functional Status Assessment Patient has had a recent decline in their functional status and demonstrates the ability to make significant improvements in function in a reasonable and predictable amount of time.     Precautions / Restrictions Precautions Precautions: Knee;Fall Required Braces or Orthoses: Knee Immobilizer - Left Knee Immobilizer - Right: Discontinue once straight  leg raise with < 10 degree lag Restrictions Weight Bearing Restrictions: No      Mobility  Bed Mobility Overal bed mobility: Needs Assistance Bed Mobility: Supine to Sit, Sit to Supine     Supine to sit: Min assist Sit to supine: Min assist   General bed mobility comments: support LLE to floor and back onto bed    Transfers                   General transfer comment: declined to stand due to report of pain    Ambulation/Gait                  Stairs            Wheelchair Mobility    Modified Rankin (Stroke Patients Only)       Balance Overall balance assessment: Mild deficits observed, not formally tested                                           Pertinent Vitals/Pain Pain Assessment Pain Assessment: 0-10 Pain Score: 8  Pain Location: left knee Pain Descriptors / Indicators: Aching, Guarding, Cramping, Crushing, Moaning, Discomfort, Grimacing Pain Intervention(s): Repositioned, Premedicated before session, Ice applied, Monitored during session, Limited activity within patient's tolerance    Home Living Family/patient expects to be discharged to:: Private residence Living Arrangements: Spouse/significant other Available Help at Discharge: Family;Available PRN/intermittently Type of Home: House Home Access: Ramped entrance       Home Layout: One level Home Equipment: Conservation officer, nature (2 wheels);Toilet riser;Wheelchair - manual      Prior Function Prior Level of  Function : Independent/Modified Independent                     Hand Dominance   Dominant Hand: Right    Extremity/Trunk Assessment   Upper Extremity Assessment Upper Extremity Assessment: Overall WFL for tasks assessed    Lower Extremity Assessment Lower Extremity Assessment: LLE deficits/detail LLE Deficits / Details: + SLR, knee flex to 70 seated    Cervical / Trunk Assessment Cervical / Trunk Assessment: Normal  Communication    Communication: No difficulties  Cognition Arousal/Alertness: Awake/alert Behavior During Therapy: WFL for tasks assessed/performed Overall Cognitive Status: Within Functional Limits for tasks assessed                                          General Comments      Exercises     Assessment/Plan    PT Assessment Patient needs continued PT services  PT Problem List Decreased strength;Decreased mobility;Decreased safety awareness;Decreased range of motion;Decreased knowledge of precautions;Decreased activity tolerance;Pain       PT Treatment Interventions DME instruction;Therapeutic activities;Gait training;Patient/family education;Therapeutic exercise;Functional mobility training    PT Goals (Current goals can be found in the Care Plan section)  Acute Rehab PT Goals Patient Stated Goal: to not hurt PT Goal Formulation: With patient/family Time For Goal Achievement: 02/12/22 Potential to Achieve Goals: Good    Frequency 7X/week     Co-evaluation               AM-PAC PT "6 Clicks" Mobility  Outcome Measure Help needed turning from your back to your side while in a flat bed without using bedrails?: A Little Help needed moving from lying on your back to sitting on the side of a flat bed without using bedrails?: A Little Help needed moving to and from a bed to a chair (including a wheelchair)?: A Lot Help needed standing up from a chair using your arms (e.g., wheelchair or bedside chair)?: A Lot Help needed to walk in hospital room?: Total Help needed climbing 3-5 steps with a railing? : Total 6 Click Score: 12    End of Session   Activity Tolerance: Patient limited by pain Patient left: in bed;with call bell/phone within reach;with family/visitor present Nurse Communication: Mobility status PT Visit Diagnosis: Unsteadiness on feet (R26.81);Difficulty in walking, not elsewhere classified (R26.2);Pain Pain - Right/Left: Left Pain - part of body: Knee     Time: 1536-1600 PT Time Calculation (min) (ACUTE ONLY): 24 min   Charges:   PT Evaluation $PT Eval Low Complexity: 1 Low PT Treatments $Therapeutic Activity: 8-22 mins           Claretha Cooper 02/05/2022, 5:01 PM

## 2022-02-05 NOTE — Interval H&P Note (Signed)
History and Physical Interval Note:  02/05/2022 7:30 AM  [name]  has presented today for surgery, with the diagnosis of Left knee osteoarthritis.  The various methods of treatment have been discussed with the patient and family. After consideration of risks, benefits and other options for treatment, the patient has consented to  Procedure(s): TOTAL KNEE ARTHROPLASTY (Left) as a surgical intervention.  The patient's history has been reviewed, patient examined, no change in status, stable for surgery.  I have reviewed the patient's chart and labs.  Questions were answered to the patient's satisfaction.     [name]

## 2022-02-05 NOTE — Op Note (Signed)
NAME: [name], [name] MEDICAL RECORD NO: [id number] ACCOUNT NO: [id number] DATE OF BIRTH: 02/04/1956 FACILITY: [facility] LOCATION: [facility] PHYSICIAN: Johnn Hai, MD  Operative Report   DATE OF PROCEDURE: 02/05/2022  PREOPERATIVE DIAGNOSES:  End-stage osteoarthrosis, valgus deformity, left knee.  POSTOPERATIVE DIAGNOSES:  End-stage osteoarthrosis, valgus deformity, left knee.  PROCEDURE PERFORMED:  Left total knee arthroplasty utilizing DePuy Attune rotating platform, 4 femur, 4 tibia, 32 patella.  ANESTHESIA:  General  ASSISTANT:  Lacie Draft, PA  HISTORY:  This is a 66 year old with end-stage osteoarthrosis, valgus deformity, left knee, indicated for replacement of the degenerated joint.  Risks and benefits discussed including bleeding, infection, damage to neurovascular structures, no change in  symptoms, worsening symptoms, DVT, PE, anesthetic complications, etc.  DESCRIPTION OF PROCEDURE:  With the patient in supine position, after induction of adequate General anesthesia, 2 grams Kefzol, left lower extremity was prepped and draped and exsanguinated in usual sterile fashion.  Thigh tourniquet inflated to 225 mmHg.   Midline incision was then made over the knee.  Full thickness flaps developed.  Median parapatellar arthrotomy was then performed.  Soft tissues gently elevated medially, preserving the MCL.  Gently everted the patella, flexed the knee, bone-on-bone  arthrosis noted in the lateral compartment as well as patellofemoral joint.  Fat pad was debrided.  Remnants of the medial and lateral meniscus and the ACL were excised.  A notch was placed above the femoral notch for the starting hole for the femoral  drill.  This was drilled, irrigated, T-handle, then intramedullary guide, 5 degrees, 9 off the distal femur was selected, pinned and performed a distal femoral cut.  Then, we sized the femur off the anterior cortex to a 4, 3 degrees of external rotation.   This was then  pinned.  I performed anterior, posterior and chamfer cuts, soft tissue protected posteriorly at all times.  Attention then turned towards the tibia, which was subluxed.  External alignment guide, we took 4 off the medial side, which was  approximately 2 off the posterolateral defect.  External alignment guide parallel to the shaft bisecting the tibiotalar joint, 3 degrees slope.  This was pinned and performed the tibial cut.  I then used an extension block of 5 in full extension, it was  stable and in flexion, it was stable with equivalent flexion and extension spaces.  I then subluxed the tibia, sized it to the 4, just the medial aspect of the tibial tubercle, maximizing coverage.  Harvested bone from the tibial canal and impacted into  the distal femur.  I then drilled centrally, used our fin punch guide.  I then turned attention back to the femur using our box cut guide, bisecting the canal, pinned and performed a box cut.  I then used a trial femur and reduced it, it fit flush.  We  placed a 5 mm insert and reduced the knee to full extension.  We had full flexion, good stability to varus, valgus stressing at 0 and 30 degrees, negative anterior drawer.  I drilled our lug holes.  Everted the patella, measured it to a 21 using our  patellar guide with a shim.  I planed it to a 14.  Measured to a 32.  Using our paddle parallel to the joint surface, drilled our peg holes, medializing this.  Placed a trial patella and reduced it and had excellent patellofemoral tracking.  All  instrumentation was then removed and inserts.  Checked posteriorly any remnants, menisci were excised.  Capsule was  intact as was the popliteus.  Following this, we used pulsatile lavage to thoroughly clean all bony surfaces.  Knee was flexed, patella  everted, thoroughly dried all surfaces after using Prontosan.  Cement was mixed on the back table in the appropriate fashion under vacuum.  I then placed cement in the proximal tibia,  digitally pressurizing it.  Cement was placed on the distal femur as  well.  Cement was placed on both tibial and femoral components.  The tibia was impacted into place.  Redundant cement removed.  Femur was impacted into place, redundant cement removed and placed a 18mm trial insert, reduced it, had an axial load  throughout the curing of the cement in extension, cemented and clamped the patella.  Prior to that, we had used Exparel, proximal medial tibia, injected through the medial capsule, withdrawing on the syringe, without breaking the vacuum and injecting  about 20 mL. Also, injected around the quadriceps tendon, the periosteum of the femur.  After curing the cement, tourniquet was deflated at 60 minutes.  Any minor bleeding was cauterized.  During the curing of the cement, the wound was covered and  Prontosan was placed in the wound.  I removed the insert and meticulously removed all redundant cement.  Copiously irrigated once again with irrigation and Prontosan and placed a 5 permanent insert, reduced it, had full extension and full flexion, good  stability to varus, valgus stressing at 0 and 30 degrees, negative anterior drawer.  Then in slight flexion, reapproximated the patellar arthrotomy with 1 Vicryl interrupted figure-of-eight sutures and then oversewn with a running Stratafix.  Following  this, I had full extension, full flexion, and excellent patellofemoral tracking.  Then, subcutaneous with 2-0 and skin with subcuticular Monocryl.  Sterile dressing was applied.  Placed in immobilizer, transported to the recovery room in satisfactory  condition.  The patient tolerated the procedure well.  No complications.  ASSISTANT:  JLacie Draft PA  BLOOD LOSS:  50 mL.   SHW D: 02/05/2022 9:48:44 am T: 02/05/2022 10:33:00 am  JOB: [id number]

## 2022-02-05 NOTE — Discharge Instructions (Signed)

## 2022-02-05 NOTE — Anesthesia Procedure Notes (Signed)
Anesthesia Regional Block: Adductor canal block   Pre-Anesthetic Checklist: , timeout performed,  Correct Patient, Correct Site, Correct Laterality,  Correct Procedure, Correct Position, site marked,  Risks and benefits discussed,  Surgical consent,  Pre-op evaluation,  At surgeon's request and post-op pain management  Laterality: Left  Prep: chloraprep       Needles:  Injection technique: Single-shot  Needle Type: Echogenic Needle     Needle Length: 9cm      Additional Needles:   Procedures:,,,, ultrasound used (permanent image in chart),,    Narrative:  Start time: 02/05/2022 7:06 AM End time: 02/05/2022 7:11 AM Injection made incrementally with aspirations every 5 mL.  Performed by: Personally  Anesthesiologist: Myrtie Soman, MD  Additional Notes: Patient tolerated the procedure well without complications

## 2022-02-05 NOTE — Brief Op Note (Signed)
02/05/2022  7:30 AM  PATIENT:  [name]  66 y.o. female  PRE-OPERATIVE DIAGNOSIS:  Left knee osteoarthritis  POST-OPERATIVE DIAGNOSIS:  Left knee osteoarthritis  PROCEDURE:  Procedure(s): TOTAL KNEE ARTHROPLASTY (Left)  SURGEON:  Surgeon(s) and Role:    Susa Day, MD - Primary  PHYSICIAN ASSISTANT:   ASSISTANTS: Bissell   ANESTHESIA:   spinal  EBL:  50   BLOOD ADMINISTERED:none  DRAINS: none   LOCAL MEDICATIONS USED:  MARCAINE     SPECIMEN:  No Specimen  DISPOSITION OF SPECIMEN:  N/A  COUNTS:  YES  TOURNIQUET:  70mn  DICTATION: .Other Dictation: Dictation Number   [id number] PLAN OF CARE: Admit for overnight observation  PATIENT DISPOSITION:  PACU - hemodynamically stable.   Delay start of Pharmacological VTE agent (>24hrs) due to surgical blood loss or risk of bleeding: no

## 2022-02-05 NOTE — Anesthesia Procedure Notes (Signed)
Anesthesia Procedure Image    

## 2022-02-05 NOTE — Transfer of Care (Signed)
Immediate Anesthesia Transfer of Care Note  Patient: [name]  Procedure(s) Performed: TOTAL KNEE ARTHROPLASTY (Left: Knee)  Patient Location: PACU  Anesthesia Type:Spinal  Level of Consciousness: sedated  Airway & Oxygen Therapy: Patient Spontanous Breathing and Patient connected to face mask oxygen  Post-op Assessment: Report given to RN and Post -op Vital signs reviewed and stable  Post vital signs: Reviewed and stable  Last Vitals:  Vitals Value Taken Time  BP    Temp    Pulse 78 02/05/22 1002  Resp 14 02/05/22 1002  SpO2 100 % 02/05/22 1002  Vitals shown include unvalidated device data.  Last Pain:  Vitals:   02/05/22 0618  TempSrc:   PainSc: 2          Complications: No notable events documented.

## 2022-02-06 ENCOUNTER — Encounter (HOSPITAL_COMMUNITY): Payer: Self-pay | Admitting: Specialist

## 2022-02-06 ENCOUNTER — Telehealth: Payer: Self-pay | Admitting: Pharmacy Technician

## 2022-02-06 DIAGNOSIS — I10 Essential (primary) hypertension: Secondary | ICD-10-CM | POA: Diagnosis not present

## 2022-02-06 DIAGNOSIS — M1712 Unilateral primary osteoarthritis, left knee: Secondary | ICD-10-CM | POA: Diagnosis not present

## 2022-02-06 DIAGNOSIS — Z596 Low income: Secondary | ICD-10-CM

## 2022-02-06 DIAGNOSIS — E119 Type 2 diabetes mellitus without complications: Secondary | ICD-10-CM | POA: Diagnosis not present

## 2022-02-06 DIAGNOSIS — Z79899 Other long term (current) drug therapy: Secondary | ICD-10-CM | POA: Diagnosis not present

## 2022-02-06 LAB — BASIC METABOLIC PANEL
Anion gap: 8 (ref 5–15)
BUN: 19 mg/dL (ref 8–23)
CO2: 26 mmol/L (ref 22–32)
Calcium: 9.4 mg/dL (ref 8.9–10.3)
Chloride: 100 mmol/L (ref 98–111)
Creatinine, Ser: 0.67 mg/dL (ref 0.44–1.00)
GFR, Estimated: >60 mL/min (ref >60)
Glucose, Bld: 147 mg/dL — ABNORMAL HIGH (ref 70–99)
Potassium: 5 mmol/L (ref 3.5–5.1)
Sodium: 134 mmol/L — ABNORMAL LOW (ref 135–145)

## 2022-02-06 LAB — CBC
HCT: 37.8 % (ref 36.0–46.0)
Hemoglobin: 12.3 g/dL (ref 12.0–15.0)
MCH: 31.4 pg (ref 26.0–34.0)
MCHC: 32.5 g/dL (ref 30.0–36.0)
MCV: 96.4 fL (ref 80.0–100.0)
Platelets: 204 10*3/uL (ref 150–400)
RBC: 3.92 MIL/uL (ref 3.87–5.11)
RDW: 12.3 % (ref 11.5–15.5)
WBC: 13.3 10*3/uL — ABNORMAL HIGH (ref 4.0–10.5)
nRBC: 0 % (ref 0.0–0.2)

## 2022-02-06 LAB — GLUCOSE, CAPILLARY
Glucose-Capillary: 130 mg/dL — ABNORMAL HIGH (ref 70–99)
Glucose-Capillary: 123 mg/dL — ABNORMAL HIGH (ref 70–99)
Glucose-Capillary: 181 mg/dL — ABNORMAL HIGH (ref 70–99)
Glucose-Capillary: 147 mg/dL — ABNORMAL HIGH (ref 70–99)

## 2022-02-06 MED ORDER — HYDROMORPHONE HCL 2 MG PO TABS
2.0000 mg | ORAL_TABLET | ORAL | Status: DC | PRN
  Administered 2022-02-06: 2 mg via ORAL
  Administered 2022-02-06 – 2022-02-07 (×3): 4 mg via ORAL
  Filled 2022-02-06: qty 2
  Filled 2022-02-06: qty 1
  Filled 2022-02-06 (×2): qty 2

## 2022-02-06 MED ORDER — GABAPENTIN 100 MG PO CAPS
100.0000 mg | ORAL_CAPSULE | Freq: Three times a day (TID) | ORAL | Status: DC
  Administered 2022-02-06 – 2022-02-07 (×3): 100 mg via ORAL
  Filled 2022-02-06 (×3): qty 1

## 2022-02-06 MED ORDER — KETOROLAC TROMETHAMINE 15 MG/ML IJ SOLN
7.5000 mg | Freq: Four times a day (QID) | INTRAMUSCULAR | Status: DC
  Administered 2022-02-06 – 2022-02-07 (×4): 7.5 mg via INTRAVENOUS
  Filled 2022-02-06 (×4): qty 1

## 2022-02-06 MED ORDER — SODIUM CHLORIDE 0.9 % IV BOLUS
500.0000 mL | Freq: Once | INTRAVENOUS | Status: AC
  Administered 2022-02-06: 500 mL via INTRAVENOUS

## 2022-02-06 MED ORDER — LORAZEPAM 1 MG PO TABS
1.0000 mg | ORAL_TABLET | Freq: Four times a day (QID) | ORAL | Status: DC | PRN
  Administered 2022-02-06: 0.5 mg via ORAL
  Filled 2022-02-06: qty 1

## 2022-02-06 NOTE — Progress Notes (Signed)
PT Cancellation Note  Patient Details Name: ULLA MCKIERNAN MRN: [id number] DOB: 1956-03-04   Cancelled Treatment:    Reason Eval/Treat Not Completed: Pain limiting ability to participate Will check back later today [place] Office [phone] Weekend pager-[phone]   Claretha Cooper 02/06/2022, 11:37 AM

## 2022-02-06 NOTE — Progress Notes (Signed)
Subjective: 1 Day Post-Op Procedure(s) (LRB): TOTAL KNEE ARTHROPLASTY (Left) Patient reports pain as 8/10. Denies CP or SOB.  Voiding without difficulty. Positive flatus. Switched from oxy to dilaudid today, added toradol low dose and ativan. Still c/o significant pain. Participated in one PT session. No N/T. Reports thigh and knee pain.  Objective: Vital signs in last 24 hours: Temp:  [97.5 F (36.4 C)-98.6 F (37 C)] 98.3 F (36.8 C) (11/30 0443) Pulse Rate:  [70-93] 89 (11/30 0443) Resp:  [12-18] 17 (11/30 0443) BP: (109-162)/(67-97) 162/86 (11/30 0443) SpO2:  [99 %-100 %] 100 % (11/30 0443)  Intake/Output from previous day: 11/29 0701 - 11/30 0700 In: 1610 [P.O.:600; I.V.:600; IV Piggyback:155] Out: 1750 [Urine:1750] Intake/Output this shift: No intake/output data recorded.  Recent Labs    02/06/22 0320  HGB 12.3   Recent Labs    02/06/22 0320  WBC 13.3*  RBC 3.92  HCT 37.8  PLT 204   Recent Labs    02/06/22 0320  NA 134*  K 5.0  CL 100  CO2 26  BUN 19  CREATININE 0.67  GLUCOSE 147*  CALCIUM 9.4   No results for input(s): "LABPT", "INR" in the last 72 hours.  Neurologically intact ABD soft Sensation intact distally Intact pulses distally Dorsiflexion/Plantar flexion intact Incision: dressing C/D/I and scant drainage No DVT  Assessment/Plan:  1 Day Post-Op Procedure(s) (LRB): TOTAL KNEE ARTHROPLASTY (Left) Advance diet Up with therapy Discharge home with home health possibly tomorrow if passes PT and pain control improved.  Will add gabapentin.  Anticipated LOS equal to or greater than 2 midnights due to - Age 68 and older with one or more of the following:  - Obesity  - Expected need for hospital services (PT, OT, Nursing) required for safe  discharge  - Anticipated need for postoperative skilled nursing care or inpatient rehab  - Active co-morbidities: None OR   - Unanticipated findings during/Post Surgery: Slow post-op progression:  GI, pain control, mobility  - Patient is a high risk of re-admission due to: None   Principal Problem:   Left knee DJD

## 2022-02-06 NOTE — TOC Transition Note (Addendum)
Transition of Care [facility]) - CM/SW Discharge Note   Patient Details  Name: [name] MRN: [id number] Date of Birth: 25-Aug-1955  Transition of Care [facility] CM/SW Contact:  Lennart Pall, LCSW Phone Number: 02/06/2022, 11:34 AM   Clinical Narrative:     Met with pt and confirming she has needed DME at home.  HHPT already arranged with [facility]. No TOC needs.  Final next level of care: OP Rehab Barriers to Discharge: No Barriers Identified   Patient Goals and CMS Choice Patient states their goals for this hospitalization and ongoing recovery are:: return home      Discharge Placement                       Discharge Plan and Services                DME Arranged: N/A DME Agency: NA       HH Arranged: PT [place] Agency: [place]        Social Determinants of Health (SDOH) Interventions     Readmission Risk Interventions     No data to display

## 2022-02-06 NOTE — Progress Notes (Signed)
[place] [facility])                                            [place] Team    02/06/2022  [name] 02-12-1956 [id number]                                      Medication Assistance Referral-FOR 2024 RE ENROLLMENT  Referral From:  [facility]  Kristeen Miss  Medication/Company: Danelle Berry / Ralph Leyden Patient application portion:  Mailed Provider application portion: Faxed  to Dr.Stephen [facility] Provider address/fax verified via: [employer]. Micaylah Bertucci, [place]  [phone]

## 2022-02-06 NOTE — Progress Notes (Signed)
Physical Therapy Treatment Patient Details Name: [name] MRN: [id number] DOB: 06/01/1955 Today's Date: 02/06/2022   History of Present Illness 66 yo female s/p LTKA 02/05/22, PMH: back surgery, DM    PT Comments    The patient reports 10/10 pain but wants to ambulate. Patient tolerated x 40' with Rw. Patient with  mild dizziness near end of walk. Encouraged ankle pumps, knee bending when she gets up to go to BR.  Elevated LLE on pillows, declined ice packs at this time. Continue PT as tolerated.    Recommendations for follow up therapy are one component of a multi-disciplinary discharge planning process, led by the attending physician.  Recommendations may be updated based on patient status, additional functional criteria and insurance authorization.  Follow Up Recommendations  Follow physician's recommendations for discharge plan and follow up therapies     Assistance Recommended at Discharge Set up Supervision/Assistance  Patient can return home with the following A little help with walking and/or transfers;A little help with bathing/dressing/bathroom;Assistance with cooking/housework;Assist for transportation   Equipment Recommendations  None recommended by PT    Recommendations for Other Services       Precautions / Restrictions Precautions Precautions: Knee;Fall Precaution Comments: did not use KI     Mobility  Bed Mobility Overal bed mobility: Needs Assistance Bed Mobility: Supine to Sit, Sit to Supine     Supine to sit: Supervision Sit to supine: Supervision   General bed mobility comments: patient  manages LLE  without assistance    Transfers Overall transfer level: Needs assistance Equipment used: Rolling walker (2 wheels) Transfers: Sit to/from Stand Sit to Stand: Min guard           General transfer comment: slow to rise    Ambulation/Gait Ambulation/Gait assistance: Min guard Gait Distance (Feet): 40 Feet Assistive device: Rolling  walker (2 wheels) Gait Pattern/deviations: Step-to pattern, Decreased stance time - left, Antalgic Gait velocity: decr     General Gait Details: patient reported dizziness  when getting back close to the bed.   Stairs             Wheelchair Mobility    Modified Rankin (Stroke Patients Only)       Balance Overall balance assessment: Mild deficits observed, not formally tested                                          Cognition Arousal/Alertness: Awake/alert Behavior During Therapy: WFL for tasks assessed/performed Overall Cognitive Status: Within Functional Limits for tasks assessed                                          Exercises      General Comments        Pertinent Vitals/Pain Pain Assessment Pain Score: 10-Worst pain ever Pain Location: left knee Pain Descriptors / Indicators: Aching, Guarding, Cramping, Crushing, Moaning, Discomfort, Grimacing Pain Intervention(s): Monitored during session, Premedicated before session, Limited activity within patient's tolerance    Home Living                          Prior Function            PT Goals (current goals can now be found in the care plan section) Progress  towards PT goals: Progressing toward goals    Frequency    7X/week      PT Plan Current plan remains appropriate    Co-evaluation              AM-PAC PT "6 Clicks" Mobility   Outcome Measure  Help needed turning from your back to your side while in a flat bed without using bedrails?: A Little Help needed moving from lying on your back to sitting on the side of a flat bed without using bedrails?: A Little Help needed moving to and from a bed to a chair (including a wheelchair)?: A Little Help needed standing up from a chair using your arms (e.g., wheelchair or bedside chair)?: A Little Help needed to walk in hospital room?: A Little Help needed climbing 3-5 steps with a railing? : A  Lot 6 Click Score: 17    End of Session Equipment Utilized During Treatment: Gait belt Activity Tolerance: Patient limited by pain;Patient tolerated treatment well Patient left: in bed;with call bell/phone within reach;with family/visitor present Nurse Communication: Mobility status PT Visit Diagnosis: Unsteadiness on feet (R26.81);Difficulty in walking, not elsewhere classified (R26.2);Pain Pain - Right/Left: Left Pain - part of body: Knee     Time: 1444-1500 PT Time Calculation (min) (ACUTE ONLY): 16 min  Charges:  $Gait Training: 8-22 mins                     [place] Office [phone] Weekend [phone]    Claretha Cooper 02/06/2022, 3:04 PM

## 2022-02-07 DIAGNOSIS — Z79899 Other long term (current) drug therapy: Secondary | ICD-10-CM | POA: Diagnosis not present

## 2022-02-07 DIAGNOSIS — M1712 Unilateral primary osteoarthritis, left knee: Secondary | ICD-10-CM | POA: Diagnosis not present

## 2022-02-07 DIAGNOSIS — E119 Type 2 diabetes mellitus without complications: Secondary | ICD-10-CM | POA: Diagnosis not present

## 2022-02-07 DIAGNOSIS — I10 Essential (primary) hypertension: Secondary | ICD-10-CM | POA: Diagnosis not present

## 2022-02-07 LAB — CBC
HCT: 34.8 % — ABNORMAL LOW (ref 36.0–46.0)
Hemoglobin: 11.8 g/dL — ABNORMAL LOW (ref 12.0–15.0)
MCH: 31.3 pg (ref 26.0–34.0)
MCHC: 33.9 g/dL (ref 30.0–36.0)
MCV: 92.3 fL (ref 80.0–100.0)
Platelets: 173 10*3/uL (ref 150–400)
RBC: 3.77 MIL/uL — ABNORMAL LOW (ref 3.87–5.11)
RDW: 12 % (ref 11.5–15.5)
WBC: 12.5 10*3/uL — ABNORMAL HIGH (ref 4.0–10.5)
nRBC: 0 % (ref 0.0–0.2)

## 2022-02-07 LAB — GLUCOSE, CAPILLARY: Glucose-Capillary: 186 mg/dL — ABNORMAL HIGH (ref 70–99)

## 2022-02-07 MED ORDER — HYDROMORPHONE HCL 2 MG PO TABS: 2.0000 mg | ORAL_TABLET | ORAL | 0 refills | Status: DC | PRN

## 2022-02-07 MED ORDER — GABAPENTIN 100 MG PO CAPS: 100.0000 mg | ORAL_CAPSULE | Freq: Three times a day (TID) | ORAL | 0 refills | Status: DC

## 2022-02-07 NOTE — Plan of Care (Signed)
  Problem: Education: Goal: Ability to describe self-care measures that may prevent or decrease complications (Diabetes Survival Skills Education) will improve Outcome: Completed/Met Goal: Individualized Educational Video(s) Outcome: Completed/Met   Problem: Coping: Goal: Ability to adjust to condition or change in health will improve 02/07/2022 1126 by Iver Nestle A, LPN Outcome: Completed/Met 02/07/2022 0756 by Lise Auer, LPN Outcome: Progressing   Problem: Fluid Volume: Goal: Ability to maintain a balanced intake and output will improve Outcome: Completed/Met   Problem: Health Behavior/Discharge Planning: Goal: Ability to identify and utilize available resources and services will improve Outcome: Completed/Met Goal: Ability to manage health-related needs will improve Outcome: Completed/Met   Problem: Metabolic: Goal: Ability to maintain appropriate glucose levels will improve Outcome: Completed/Met   Problem: Nutritional: Goal: Maintenance of adequate nutrition will improve Outcome: Completed/Met Goal: Progress toward achieving an optimal weight will improve Outcome: Completed/Met   Problem: Skin Integrity: Goal: Risk for impaired skin integrity will decrease Outcome: Completed/Met   Problem: Tissue Perfusion: Goal: Adequacy of tissue perfusion will improve Outcome: Completed/Met   Problem: Education: Goal: Knowledge of the prescribed therapeutic regimen will improve Outcome: Completed/Met Goal: Individualized Educational Video(s) Outcome: Completed/Met   Problem: Activity: Goal: Ability to avoid complications of mobility impairment will improve 02/07/2022 1126 by Iver Nestle A, LPN Outcome: Completed/Met 02/07/2022 0756 by Lise Auer, LPN Outcome: Progressing Goal: Range of joint motion will improve Outcome: Completed/Met   Problem: Clinical Measurements: Goal: Postoperative complications will be avoided or minimized Outcome:  Completed/Met   Problem: Pain Management: Goal: Pain level will decrease with appropriate interventions Outcome: Completed/Met   Problem: Skin Integrity: Goal: Will show signs of wound healing Outcome: Completed/Met   Problem: Education: Goal: Knowledge of General Education information will improve Description: Including pain rating scale, medication(s)/side effects and non-pharmacologic comfort measures Outcome: Completed/Met   Problem: Health Behavior/Discharge Planning: Goal: Ability to manage health-related needs will improve Outcome: Completed/Met   Problem: Clinical Measurements: Goal: Ability to maintain clinical measurements within normal limits will improve Outcome: Completed/Met Goal: Will remain free from infection Outcome: Completed/Met Goal: Diagnostic test results will improve Outcome: Completed/Met Goal: Respiratory complications will improve Outcome: Completed/Met Goal: Cardiovascular complication will be avoided Outcome: Completed/Met   Problem: Activity: Goal: Risk for activity intolerance will decrease 02/07/2022 1126 by Iver Nestle A, LPN Outcome: Completed/Met 02/07/2022 0756 by Lise Auer, LPN Outcome: Progressing   Problem: Nutrition: Goal: Adequate nutrition will be maintained Outcome: Completed/Met   Problem: Coping: Goal: Level of anxiety will decrease Outcome: Completed/Met   Problem: Elimination: Goal: Will not experience complications related to bowel motility Outcome: Completed/Met Goal: Will not experience complications related to urinary retention Outcome: Completed/Met   Problem: Pain Managment: Goal: General experience of comfort will improve Outcome: Completed/Met   Problem: Safety: Goal: Ability to remain free from injury will improve Outcome: Completed/Met   Problem: Skin Integrity: Goal: Risk for impaired skin integrity will decrease Outcome: Completed/Met

## 2022-02-07 NOTE — Plan of Care (Signed)
  Problem: Coping: Goal: Ability to adjust to condition or change in health will improve Outcome: Progressing   Problem: Activity: Goal: Ability to avoid complications of mobility impairment will improve Outcome: Progressing   Problem: Activity: Goal: Risk for activity intolerance will decrease Outcome: Progressing

## 2022-02-07 NOTE — Progress Notes (Signed)
Subjective: 2 Days Post-Op Procedure(s) (LRB): TOTAL KNEE ARTHROPLASTY (Left) Patient reports pain as moderate.   Pain improved from yesterday. Feels ready to go home. No fever, chills, N/V, numbness, tingling. Voiding without difficulty  Objective: Vital signs in last 24 hours: Temp:  [98.2 F (36.8 C)-99.1 F (37.3 C)] 98.2 F (36.8 C) (12/01 0445) Pulse Rate:  [82-90] 86 (12/01 0445) Resp:  [17-18] 17 (12/01 0445) BP: (139-161)/(70-91) 139/91 (12/01 0445) SpO2:  [99 %-100 %] 99 % (12/01 0445)  Intake/Output from previous day: 11/30 0701 - 12/01 0700 In: 480 [P.O.:480] Out: 1200 [Urine:1200] Intake/Output this shift: No intake/output data recorded.  Recent Labs    02/06/22 0320 02/07/22 0320  HGB 12.3 11.8*   Recent Labs    02/06/22 0320 02/07/22 0320  WBC 13.3* 12.5*  RBC 3.92 3.77*  HCT 37.8 34.8*  PLT 204 173   Recent Labs    02/06/22 0320  NA 134*  K 5.0  CL 100  CO2 26  BUN 19  CREATININE 0.67  GLUCOSE 147*  CALCIUM 9.4   No results for input(s): "LABPT", "INR" in the last 72 hours.  Neurologically intact ABD soft Neurovascular intact Sensation intact distally Intact pulses distally Dorsiflexion/Plantar flexion intact Incision: dressing C/D/I and no drainage No cellulitis present Compartment soft No sign of DVT   Assessment/Plan: 2 Days Post-Op Procedure(s) (LRB): TOTAL KNEE ARTHROPLASTY (Left) Advance diet Up with therapy D/C IV fluids D/C to home today HHPT arranged   Cecilie Kicks 02/07/2022, 8:22 AM

## 2022-02-07 NOTE — Discharge Summary (Signed)
Physician Discharge Summary   Patient ID: [name] MRN: [id number] DOB/AGE: September 18, 1955 66 y.o.  Admit date: 02/05/2022 Discharge date: 02/07/22  Primary Diagnosis: left knee primary osteoarthritis  Admission Diagnoses:  Past Medical History:  Diagnosis Date   Arthritis    arhtritis back and hands   Diabetes mellitus without complication (HCC)    GERD (gastroesophageal reflux disease)    Hypertension    Leg cramps, sleep related    bi lat severe   Discharge Diagnoses:   Principal Problem:   Left knee DJD  Estimated body mass index is 20.31 kg/m as calculated from the following:   Height as of this encounter: '5\' 3"'$  (1.6 m).   Weight as of this encounter: 52 kg.  Procedure:  Procedure(s) (LRB): TOTAL KNEE ARTHROPLASTY (Left)   Consults: None  HPI: see H&P Laboratory Data: Admission on 02/05/2022  Component Date Value Ref Range Status   Glucose-Capillary 02/05/2022 118 (H)  70 - 99 mg/dL Final   Glucose reference range applies only to samples taken after fasting for at least 8 hours.   Glucose-Capillary 02/05/2022 117 (H)  70 - 99 mg/dL Final   Glucose reference range applies only to samples taken after fasting for at least 8 hours.   Glucose-Capillary 02/05/2022 221 (H)  70 - 99 mg/dL Final   Glucose reference range applies only to samples taken after fasting for at least 8 hours.   WBC 02/06/2022 13.3 (H)  4.0 - 10.5 K/uL Final   RBC 02/06/2022 3.92  3.87 - 5.11 MIL/uL Final   Hemoglobin 02/06/2022 12.3  12.0 - 15.0 g/dL Final   HCT 02/06/2022 37.8  36.0 - 46.0 % Final   MCV 02/06/2022 96.4  80.0 - 100.0 fL Final   MCH 02/06/2022 31.4  26.0 - 34.0 pg Final   MCHC 02/06/2022 32.5  30.0 - 36.0 g/dL Final   RDW 02/06/2022 12.3  11.5 - 15.5 % Final   Platelets 02/06/2022 204  150 - 400 K/uL Final   nRBC 02/06/2022 0.0  0.0 - 0.2 % Final   Performed at [facility] [place] [address], [place], [place] [address]   Sodium 02/06/2022 134 (L)   135 - 145 mmol/L Final   Potassium 02/06/2022 5.0  3.5 - 5.1 mmol/L Final   Chloride 02/06/2022 100  98 - 111 mmol/L Final   CO2 02/06/2022 26  22 - 32 mmol/L Final   Glucose, Bld 02/06/2022 147 (H)  70 - 99 mg/dL Final   Glucose reference range applies only to samples taken after fasting for at least 8 hours.   BUN 02/06/2022 19  8 - 23 mg/dL Final   Creatinine, Ser 02/06/2022 0.67  0.44 - 1.00 mg/dL Final   Calcium 02/06/2022 9.4  8.9 - 10.3 mg/dL Final   GFR, Estimated 02/06/2022 >60  >60 mL/min Final   Comment: (NOTE) Calculated using the CKD-EPI Creatinine Equation (2021)    Anion gap 02/06/2022 8  5 - 15 Final   Performed at [facility], [place] [address] [place], [place] [address]   Glucose-Capillary 02/05/2022 140 (H)  70 - 99 mg/dL Final   Glucose reference range applies only to samples taken after fasting for at least 8 hours.   Glucose-Capillary 02/06/2022 130 (H)  70 - 99 mg/dL Final   Glucose reference range applies only to samples taken after fasting for at least 8 hours.   Glucose-Capillary 02/06/2022 123 (H)  70 - 99 mg/dL Final   Glucose  reference range applies only to samples taken after fasting for at least 8 hours.   WBC 02/07/2022 12.5 (H)  4.0 - 10.5 K/uL Final   RBC 02/07/2022 3.77 (L)  3.87 - 5.11 MIL/uL Final   Hemoglobin 02/07/2022 11.8 (L)  12.0 - 15.0 g/dL Final   HCT 02/07/2022 34.8 (L)  36.0 - 46.0 % Final   MCV 02/07/2022 92.3  80.0 - 100.0 fL Final   MCH 02/07/2022 31.3  26.0 - 34.0 pg Final   MCHC 02/07/2022 33.9  30.0 - 36.0 g/dL Final   RDW 02/07/2022 12.0  11.5 - 15.5 % Final   Platelets 02/07/2022 173  150 - 400 K/uL Final   nRBC 02/07/2022 0.0  0.0 - 0.2 % Final   Performed at [facility], [place] [address], [place], [place] [address]   Glucose-Capillary 02/06/2022 181 (H)  70 - 99 mg/dL Final   Glucose reference range applies only to samples taken after fasting for at least 8 hours.   Glucose-Capillary  02/06/2022 147 (H)  70 - 99 mg/dL Final   Glucose reference range applies only to samples taken after fasting for at least 8 hours.   Glucose-Capillary 02/07/2022 186 (H)  70 - 99 mg/dL Final   Glucose reference range applies only to samples taken after fasting for at least 8 hours.  Hospital Outpatient Visit on 01/28/2022  Component Date Value Ref Range Status   Hgb A1c MFr Bld 01/28/2022 5.9 (H)  4.8 - 5.6 % Final   Comment: (NOTE) Pre diabetes:          5.7%-6.4%  Diabetes:              >6.4%  Glycemic control for   <7.0% adults with diabetes    Mean Plasma Glucose 01/28/2022 122.63  mg/dL Final   Performed at [facility], [place] [address] [place], [place] [address]   Sodium 01/28/2022 137  135 - 145 mmol/L Final   Potassium 01/28/2022 4.4  3.5 - 5.1 mmol/L Final   Chloride 01/28/2022 104  98 - 111 mmol/L Final   CO2 01/28/2022 26  22 - 32 mmol/L Final   Glucose, Bld 01/28/2022 98  70 - 99 mg/dL Final   Glucose reference range applies only to samples taken after fasting for at least 8 hours.   BUN 01/28/2022 22  8 - 23 mg/dL Final   Creatinine, Ser 01/28/2022 0.80  0.44 - 1.00 mg/dL Final   Calcium 01/28/2022 9.7  8.9 - 10.3 mg/dL Final   GFR, Estimated 01/28/2022 >60  >60 mL/min Final   Comment: (NOTE) Calculated using the CKD-EPI Creatinine Equation (2021)    Anion gap 01/28/2022 7  5 - 15 Final   Performed at [facility], [place] [address], [place], [place] [address]   WBC 01/28/2022 8.0  4.0 - 10.5 K/uL Final   RBC 01/28/2022 4.20  3.87 - 5.11 MIL/uL Final   Hemoglobin 01/28/2022 13.4  12.0 - 15.0 g/dL Final   HCT 01/28/2022 40.8  36.0 - 46.0 % Final   MCV 01/28/2022 97.1  80.0 - 100.0 fL Final   MCH 01/28/2022 31.9  26.0 - 34.0 pg Final   MCHC 01/28/2022 32.8  30.0 - 36.0 g/dL Final   RDW 01/28/2022 12.5  11.5 - 15.5 % Final   Platelets 01/28/2022 271  150 - 400 K/uL Final   nRBC 01/28/2022 0.0  0.0 - 0.2 % Final   Performed at [facility], [place] [address] [place], [place] [address]   MRSA, PCR 01/28/2022 NEGATIVE  NEGATIVE Final   Staphylococcus aureus 01/28/2022 NEGATIVE  NEGATIVE Final   Comment: (NOTE) The Xpert SA Assay (FDA approved for NASAL specimens in patients 79 years of age and older), is one component of a comprehensive surveillance program. It is not intended to diagnose infection nor to guide or monitor treatment. Performed at [facility], [place] [address] [place], [place] [address]    Glucose-Capillary 01/28/2022 105 (H)  70 - 99 mg/dL Final   Glucose reference range applies only to samples taken after fasting for at least 8 hours.     X-Rays:DG Knee 1-2 Views Left  Result Date: 02/05/2022 CLINICAL DATA:  Status post left total knee replacement EXAM: LEFT KNEE - 1-2 VIEW COMPARISON:  None Available. FINDINGS: Postsurgical changes from left total knee replacement. Hardware components appear intact and well seated. Postsurgical subcutaneous emphysema and soft tissue swelling. IMPRESSION: Postsurgical changes from left total knee replacement. Electronically Signed   By: Darrin Nipper M.D.   On: 02/05/2022 10:40    EKG: Orders placed or performed during the hospital encounter of 01/28/22   EKG 12 lead per protocol   EKG 12 lead per protocol     Hospital Course: [name] is a 66 y.o. who was [facility]. They were brought to the operating room on 02/05/2022 and underwent Procedure(s): TOTAL KNEE ARTHROPLASTY.  Patient tolerated the procedure well and was later transferred to the recovery room and then to the orthopaedic floor for postoperative care.  They were given PO and IV analgesics for pain control following their surgery.  They were given 24 hours of postoperative antibiotics of  Anti-infectives (From admission, onward)    Start     Dose/Rate Route Frequency Ordered Stop   02/05/22 1530  ceFAZolin (ANCEF) IVPB 1 g/50 mL premix        1  g 100 mL/hr over 30 Minutes Intravenous Every 6 hours 02/05/22 1406 02/05/22 2145   02/05/22 0600  ceFAZolin (ANCEF) IVPB 2g/100 mL premix        2 g 200 mL/hr over 30 Minutes Intravenous On call to O.R. 02/05/22 [id number] 02/05/22 0735      and started on DVT prophylaxis in the form of Aspirin, TED hose, and SCDs .   PT and OT were ordered for total joint protocol.  Discharge planning consulted to help with postop disposition and equipment needs.  Patient had a difficult night on the evening of surgery.  They started to get up OOB with therapy on day one. Continued to work with therapy into day two.  By day two, the patient had progressed with therapy and meeting their goals.  Incision was healing well.  Patient was seen in rounds and was ready to go home.   Diet: Regular diet Activity:WBAT Follow-up:in 14 days Disposition - Home with HHPT Discharged Condition: good   Discharge Instructions     Call MD / Call 911   Complete by: As directed    If you experience chest pain or shortness of breath, CALL 911 and be transported to the hospital emergency room.  If you develope a fever above 101 F, pus (white drainage) or increased drainage or redness at the wound, or calf pain, call your surgeon's office.   Constipation Prevention   Complete by: As directed    Drink plenty of fluids.  Prune juice may be helpful.  You may  use a stool softener, such as Colace (over the counter) 100 mg twice a day.  Use MiraLax (over the counter) for constipation as needed.   Diet - low sodium heart healthy   Complete by: As directed    Increase activity slowly as tolerated   Complete by: As directed    Post-operative opioid taper instructions:   Complete by: As directed    POST-OPERATIVE OPIOID TAPER INSTRUCTIONS: It is important to wean off of your opioid medication as soon as possible. If you do not need pain medication after your surgery it is ok to stop day one. Opioids include: Codeine, Hydrocodone(Norco,  Vicodin), Oxycodone(Percocet, oxycontin) and hydromorphone amongst others.  Long term and even short term use of opiods can cause: Increased pain response Dependence Constipation Depression Respiratory depression And more.  Withdrawal symptoms can include Flu like symptoms Nausea, vomiting And more Techniques to manage these symptoms Hydrate well Eat regular healthy meals Stay active Use relaxation techniques(deep breathing, meditating, yoga) Do Not substitute Alcohol to help with tapering If you have been on opioids for less than two weeks and do not have pain than it is ok to stop all together.  Plan to wean off of opioids This plan should start within one week post op of your joint replacement. Maintain the same interval or time between taking each dose and first decrease the dose.  Cut the total daily intake of opioids by one tablet each day Next start to increase the time between doses. The last dose that should be eliminated is the evening dose.         Allergies as of 02/07/2022   No Known Allergies      Medication List     TAKE these medications    acetaminophen 500 MG tablet Commonly known as: TYLENOL Take 2,000 mg by mouth daily as needed for moderate pain.   aspirin EC 81 MG tablet Take 1 tablet (81 mg total) by mouth 2 (two) times daily after a meal. Day after surgery   benazepril-hydrochlorthiazide 10-12.5 MG tablet Commonly known as: LOTENSIN HCT Take 1 tablet by mouth every morning.   docusate sodium 100 MG capsule Commonly known as: Colace Take 1 capsule (100 mg total) by mouth 2 (two) times daily as needed for mild constipation.   gabapentin 100 MG capsule Commonly known as: NEURONTIN Take 1 capsule (100 mg total) by mouth 3 (three) times daily.   HYDROmorphone 2 MG tablet Commonly known as: DILAUDID Take 1-2 tablets (2-4 mg total) by mouth every 3 (three) hours as needed for severe pain.   latanoprost 0.005 % ophthalmic  solution Commonly known as: XALATAN Place 1 drop into both eyes at bedtime.   pantoprazole 40 MG tablet Commonly known as: PROTONIX Take 40 mg by mouth every evening.   polyethylene glycol 17 g packet Commonly known as: MIRALAX / GLYCOLAX Take 17 g by mouth daily.   Trulicity 6.43 PI/9.5JO Sopn Generic drug: Dulaglutide Inject 0.75 mg into the skin every Sunday.        Follow-up Information     Susa Day, MD Follow up in 2 week(s).   Specialty: Orthopedic Surgery Contact information: [address] [place] [address] [phone]                 Signed: Lacie Draft, PA-C Orthopaedic Surgery 02/07/2022, 8:24 AM

## 2022-02-07 NOTE — Progress Notes (Signed)
Physical Therapy Treatment Patient Details Name: [name] MRN: [id number] DOB: [birth year] Today's Date: 02/07/2022   History of Present Illness 66 yo female s/p LTKA 02/05/22, PMH: back surgery, DM    PT Comments    POD x 2 am session. Patient will not need 2nd session due to mobility goals being met. Pt c/o of 2/10 pain in L knee, no increase in pain throughout session. Required supervision for bed mobility, uses modified safety belt to guide LLE off/on bed; Min guard needed for transfers with increased time needed; Min guard needed to ambulate 90 ft with RW for safety. Reviewed HEP with pt and ex-husband and addressed any questions. Patient plans on returning home with ex-husband support.   Recommendations for follow up therapy are one component of a multi-disciplinary discharge planning process, led by the attending physician.  Recommendations may be updated based on patient status, additional functional criteria and insurance authorization.  Follow Up Recommendations  Follow physician's recommendations for discharge plan and follow up therapies     Assistance Recommended at Discharge Set up Supervision/Assistance  Patient can return home with the following A little help with walking and/or transfers;A little help with bathing/dressing/bathroom;Assistance with cooking/housework;Assist for transportation   Equipment Recommendations  None recommended by PT    Recommendations for Other Services       Precautions / Restrictions Precautions Precautions: Knee;Fall Precaution Comments: did not use KI Restrictions Weight Bearing Restrictions: No     Mobility  Bed Mobility Overal bed mobility: Needs Assistance Bed Mobility: Supine to Sit, Sit to Supine     Supine to sit: Supervision, HOB elevated Sit to supine: Supervision   General bed mobility comments: Supervision required for bed mobility, uses modified safety belt to guide LLE off/on bed.    Transfers Overall  transfer level: Needs assistance Equipment used: Rolling walker (2 wheels) Transfers: Sit to/from Stand Sit to Stand: Min guard           General transfer comment: increased time needed    Ambulation/Gait Ambulation/Gait assistance: Min guard Gait Distance (Feet): 90 Feet Assistive device: Rolling walker (2 wheels) Gait Pattern/deviations: Step-to pattern, Decreased stance time - left Gait velocity: decr     General Gait Details: Min guard needed for safety. No c/o of dizziness while ambulating this session.   Stairs             Wheelchair Mobility    Modified Rankin (Stroke Patients Only)       Balance                                            Cognition Arousal/Alertness: Awake/alert Behavior During Therapy: WFL for tasks assessed/performed Overall Cognitive Status: Within Functional Limits for tasks assessed                                          Exercises Total Joint Exercises Ankle Circles/Pumps: AROM, 10 reps, Both Quad Sets: AROM, Left, 5 reps Towel Squeeze: AROM, 5 reps, Left Short Arc Quad: AROM, Left, 5 reps Heel Slides: AAROM, Left, 5 reps Hip ABduction/ADduction: AAROM, 5 reps, Left, AROM Straight Leg Raises: AAROM, 5 reps, Left    General Comments        Pertinent Vitals/Pain Pain Assessment Pain Assessment: 0-10 Pain Score: 2  Pain Location: left knee Pain Descriptors / Indicators: Aching, Guarding, Cramping, Crushing, Moaning, Discomfort, Grimacing Pain Intervention(s): Limited activity within patient's tolerance, Monitored during session, Ice applied    Home Living Family/patient expects to be discharged to:: Private residence Living Arrangements: Spouse/significant other Available Help at Discharge: Family;Available PRN/intermittently Type of Home: House Home Access: Ramped entrance       Home Layout: One level Home Equipment: Conservation officer, nature (2 wheels);Toilet riser;Wheelchair -  manual      Prior Function            PT Goals (current goals can now be found in the care plan section) Acute Rehab PT Goals Patient Stated Goal: to not hurt PT Goal Formulation: With patient/family Time For Goal Achievement: 02/12/22 Potential to Achieve Goals: Good    Frequency    7X/week      PT Plan      Co-evaluation              AM-PAC PT "6 Clicks" Mobility   Outcome Measure  Help needed turning from your back to your side while in a flat bed without using bedrails?: A Little Help needed moving from lying on your back to sitting on the side of a flat bed without using bedrails?: A Little Help needed moving to and from a bed to a chair (including a wheelchair)?: A Little Help needed standing up from a chair using your arms (e.g., wheelchair or bedside chair)?: A Little Help needed to walk in hospital room?: A Little Help needed climbing 3-5 steps with a railing? : A Lot 6 Click Score: 17    End of Session Equipment Utilized During Treatment: Gait belt Activity Tolerance: Patient tolerated treatment well;No increased pain Patient left: in bed;with call bell/phone within reach;with family/visitor present;with bed alarm set Nurse Communication: Mobility status PT Visit Diagnosis: Unsteadiness on feet (R26.81);Difficulty in walking, not elsewhere classified (R26.2);Pain Pain - Right/Left: Left Pain - part of body: Knee     Time: 1000-1025 PT Time Calculation (min) (ACUTE ONLY): 25 min  Charges:  $Gait Training: 8-22 mins $Therapeutic Exercise: 8-22 mins                        Allyson Sabal 02/07/2022, 12:17 PM

## 2022-02-08 DIAGNOSIS — K219 Gastro-esophageal reflux disease without esophagitis: Secondary | ICD-10-CM | POA: Diagnosis not present

## 2022-02-08 DIAGNOSIS — I10 Essential (primary) hypertension: Secondary | ICD-10-CM | POA: Diagnosis not present

## 2022-02-08 DIAGNOSIS — M48061 Spinal stenosis, lumbar region without neurogenic claudication: Secondary | ICD-10-CM | POA: Diagnosis not present

## 2022-02-08 DIAGNOSIS — G709 Myoneural disorder, unspecified: Secondary | ICD-10-CM | POA: Diagnosis not present

## 2022-02-08 DIAGNOSIS — Z96652 Presence of left artificial knee joint: Secondary | ICD-10-CM | POA: Diagnosis not present

## 2022-02-08 DIAGNOSIS — Z471 Aftercare following joint replacement surgery: Secondary | ICD-10-CM | POA: Diagnosis not present

## 2022-02-08 DIAGNOSIS — E119 Type 2 diabetes mellitus without complications: Secondary | ICD-10-CM | POA: Diagnosis not present

## 2022-02-08 DIAGNOSIS — Z9181 History of falling: Secondary | ICD-10-CM | POA: Diagnosis not present

## 2022-02-08 DIAGNOSIS — Z7985 Long-term (current) use of injectable non-insulin antidiabetic drugs: Secondary | ICD-10-CM | POA: Diagnosis not present

## 2022-02-08 DIAGNOSIS — Z7982 Long term (current) use of aspirin: Secondary | ICD-10-CM | POA: Diagnosis not present

## 2022-02-10 DIAGNOSIS — Z9181 History of falling: Secondary | ICD-10-CM | POA: Diagnosis not present

## 2022-02-10 DIAGNOSIS — Z471 Aftercare following joint replacement surgery: Secondary | ICD-10-CM | POA: Diagnosis not present

## 2022-02-10 DIAGNOSIS — Z96652 Presence of left artificial knee joint: Secondary | ICD-10-CM | POA: Diagnosis not present

## 2022-02-10 DIAGNOSIS — Z7985 Long-term (current) use of injectable non-insulin antidiabetic drugs: Secondary | ICD-10-CM | POA: Diagnosis not present

## 2022-02-10 DIAGNOSIS — K219 Gastro-esophageal reflux disease without esophagitis: Secondary | ICD-10-CM | POA: Diagnosis not present

## 2022-02-10 DIAGNOSIS — I10 Essential (primary) hypertension: Secondary | ICD-10-CM | POA: Diagnosis not present

## 2022-02-10 DIAGNOSIS — E119 Type 2 diabetes mellitus without complications: Secondary | ICD-10-CM | POA: Diagnosis not present

## 2022-02-10 DIAGNOSIS — M48061 Spinal stenosis, lumbar region without neurogenic claudication: Secondary | ICD-10-CM | POA: Diagnosis not present

## 2022-02-10 DIAGNOSIS — G709 Myoneural disorder, unspecified: Secondary | ICD-10-CM | POA: Diagnosis not present

## 2022-02-10 DIAGNOSIS — Z7982 Long term (current) use of aspirin: Secondary | ICD-10-CM | POA: Diagnosis not present

## 2022-02-12 DIAGNOSIS — E119 Type 2 diabetes mellitus without complications: Secondary | ICD-10-CM | POA: Diagnosis not present

## 2022-02-12 DIAGNOSIS — Z9181 History of falling: Secondary | ICD-10-CM | POA: Diagnosis not present

## 2022-02-12 DIAGNOSIS — G709 Myoneural disorder, unspecified: Secondary | ICD-10-CM | POA: Diagnosis not present

## 2022-02-12 DIAGNOSIS — Z7985 Long-term (current) use of injectable non-insulin antidiabetic drugs: Secondary | ICD-10-CM | POA: Diagnosis not present

## 2022-02-12 DIAGNOSIS — M48061 Spinal stenosis, lumbar region without neurogenic claudication: Secondary | ICD-10-CM | POA: Diagnosis not present

## 2022-02-12 DIAGNOSIS — Z471 Aftercare following joint replacement surgery: Secondary | ICD-10-CM | POA: Diagnosis not present

## 2022-02-12 DIAGNOSIS — Z7982 Long term (current) use of aspirin: Secondary | ICD-10-CM | POA: Diagnosis not present

## 2022-02-12 DIAGNOSIS — I10 Essential (primary) hypertension: Secondary | ICD-10-CM | POA: Diagnosis not present

## 2022-02-12 DIAGNOSIS — Z96652 Presence of left artificial knee joint: Secondary | ICD-10-CM | POA: Diagnosis not present

## 2022-02-12 DIAGNOSIS — K219 Gastro-esophageal reflux disease without esophagitis: Secondary | ICD-10-CM | POA: Diagnosis not present

## 2022-02-14 DIAGNOSIS — Z471 Aftercare following joint replacement surgery: Secondary | ICD-10-CM | POA: Diagnosis not present

## 2022-02-14 DIAGNOSIS — M48061 Spinal stenosis, lumbar region without neurogenic claudication: Secondary | ICD-10-CM | POA: Diagnosis not present

## 2022-02-14 DIAGNOSIS — Z96652 Presence of left artificial knee joint: Secondary | ICD-10-CM | POA: Diagnosis not present

## 2022-02-14 DIAGNOSIS — Z7985 Long-term (current) use of injectable non-insulin antidiabetic drugs: Secondary | ICD-10-CM | POA: Diagnosis not present

## 2022-02-14 DIAGNOSIS — Z7982 Long term (current) use of aspirin: Secondary | ICD-10-CM | POA: Diagnosis not present

## 2022-02-14 DIAGNOSIS — Z9181 History of falling: Secondary | ICD-10-CM | POA: Diagnosis not present

## 2022-02-14 DIAGNOSIS — E119 Type 2 diabetes mellitus without complications: Secondary | ICD-10-CM | POA: Diagnosis not present

## 2022-02-14 DIAGNOSIS — G709 Myoneural disorder, unspecified: Secondary | ICD-10-CM | POA: Diagnosis not present

## 2022-02-14 DIAGNOSIS — K219 Gastro-esophageal reflux disease without esophagitis: Secondary | ICD-10-CM | POA: Diagnosis not present

## 2022-02-14 DIAGNOSIS — I10 Essential (primary) hypertension: Secondary | ICD-10-CM | POA: Diagnosis not present

## 2022-02-17 DIAGNOSIS — Z9181 History of falling: Secondary | ICD-10-CM | POA: Diagnosis not present

## 2022-02-17 DIAGNOSIS — G709 Myoneural disorder, unspecified: Secondary | ICD-10-CM | POA: Diagnosis not present

## 2022-02-17 DIAGNOSIS — E119 Type 2 diabetes mellitus without complications: Secondary | ICD-10-CM | POA: Diagnosis not present

## 2022-02-17 DIAGNOSIS — Z96652 Presence of left artificial knee joint: Secondary | ICD-10-CM | POA: Diagnosis not present

## 2022-02-17 DIAGNOSIS — K219 Gastro-esophageal reflux disease without esophagitis: Secondary | ICD-10-CM | POA: Diagnosis not present

## 2022-02-17 DIAGNOSIS — Z7982 Long term (current) use of aspirin: Secondary | ICD-10-CM | POA: Diagnosis not present

## 2022-02-17 DIAGNOSIS — Z471 Aftercare following joint replacement surgery: Secondary | ICD-10-CM | POA: Diagnosis not present

## 2022-02-17 DIAGNOSIS — I10 Essential (primary) hypertension: Secondary | ICD-10-CM | POA: Diagnosis not present

## 2022-02-17 DIAGNOSIS — M48061 Spinal stenosis, lumbar region without neurogenic claudication: Secondary | ICD-10-CM | POA: Diagnosis not present

## 2022-02-17 DIAGNOSIS — Z7985 Long-term (current) use of injectable non-insulin antidiabetic drugs: Secondary | ICD-10-CM | POA: Diagnosis not present

## 2022-02-19 DIAGNOSIS — Z471 Aftercare following joint replacement surgery: Secondary | ICD-10-CM | POA: Diagnosis not present

## 2022-02-19 DIAGNOSIS — Z9181 History of falling: Secondary | ICD-10-CM | POA: Diagnosis not present

## 2022-02-19 DIAGNOSIS — E119 Type 2 diabetes mellitus without complications: Secondary | ICD-10-CM | POA: Diagnosis not present

## 2022-02-19 DIAGNOSIS — M48061 Spinal stenosis, lumbar region without neurogenic claudication: Secondary | ICD-10-CM | POA: Diagnosis not present

## 2022-02-19 DIAGNOSIS — G709 Myoneural disorder, unspecified: Secondary | ICD-10-CM | POA: Diagnosis not present

## 2022-02-19 DIAGNOSIS — Z7982 Long term (current) use of aspirin: Secondary | ICD-10-CM | POA: Diagnosis not present

## 2022-02-19 DIAGNOSIS — K219 Gastro-esophageal reflux disease without esophagitis: Secondary | ICD-10-CM | POA: Diagnosis not present

## 2022-02-19 DIAGNOSIS — I10 Essential (primary) hypertension: Secondary | ICD-10-CM | POA: Diagnosis not present

## 2022-02-19 DIAGNOSIS — Z7985 Long-term (current) use of injectable non-insulin antidiabetic drugs: Secondary | ICD-10-CM | POA: Diagnosis not present

## 2022-02-19 DIAGNOSIS — Z96652 Presence of left artificial knee joint: Secondary | ICD-10-CM | POA: Diagnosis not present

## 2022-02-20 DIAGNOSIS — K219 Gastro-esophageal reflux disease without esophagitis: Secondary | ICD-10-CM | POA: Diagnosis not present

## 2022-02-20 DIAGNOSIS — Z5189 Encounter for other specified aftercare: Secondary | ICD-10-CM | POA: Diagnosis not present

## 2022-02-20 DIAGNOSIS — E119 Type 2 diabetes mellitus without complications: Secondary | ICD-10-CM | POA: Diagnosis not present

## 2022-02-20 DIAGNOSIS — E782 Mixed hyperlipidemia: Secondary | ICD-10-CM | POA: Diagnosis not present

## 2022-02-20 DIAGNOSIS — Z13820 Encounter for screening for osteoporosis: Secondary | ICD-10-CM | POA: Diagnosis not present

## 2022-02-20 DIAGNOSIS — I10 Essential (primary) hypertension: Secondary | ICD-10-CM | POA: Diagnosis not present

## 2022-02-20 DIAGNOSIS — Z0000 Encounter for general adult medical examination without abnormal findings: Secondary | ICD-10-CM | POA: Diagnosis not present

## 2022-02-20 DIAGNOSIS — Z96652 Presence of left artificial knee joint: Secondary | ICD-10-CM | POA: Diagnosis not present

## 2022-02-21 DIAGNOSIS — M48061 Spinal stenosis, lumbar region without neurogenic claudication: Secondary | ICD-10-CM | POA: Diagnosis not present

## 2022-02-21 DIAGNOSIS — Z7985 Long-term (current) use of injectable non-insulin antidiabetic drugs: Secondary | ICD-10-CM | POA: Diagnosis not present

## 2022-02-21 DIAGNOSIS — Z7982 Long term (current) use of aspirin: Secondary | ICD-10-CM | POA: Diagnosis not present

## 2022-02-21 DIAGNOSIS — Z96652 Presence of left artificial knee joint: Secondary | ICD-10-CM | POA: Diagnosis not present

## 2022-02-21 DIAGNOSIS — K219 Gastro-esophageal reflux disease without esophagitis: Secondary | ICD-10-CM | POA: Diagnosis not present

## 2022-02-21 DIAGNOSIS — G709 Myoneural disorder, unspecified: Secondary | ICD-10-CM | POA: Diagnosis not present

## 2022-02-21 DIAGNOSIS — Z471 Aftercare following joint replacement surgery: Secondary | ICD-10-CM | POA: Diagnosis not present

## 2022-02-21 DIAGNOSIS — I10 Essential (primary) hypertension: Secondary | ICD-10-CM | POA: Diagnosis not present

## 2022-02-21 DIAGNOSIS — E119 Type 2 diabetes mellitus without complications: Secondary | ICD-10-CM | POA: Diagnosis not present

## 2022-02-21 DIAGNOSIS — Z9181 History of falling: Secondary | ICD-10-CM | POA: Diagnosis not present

## 2022-02-28 DIAGNOSIS — M25662 Stiffness of left knee, not elsewhere classified: Secondary | ICD-10-CM | POA: Diagnosis not present

## 2022-03-06 ENCOUNTER — Other Ambulatory Visit: Payer: Self-pay | Admitting: Family Medicine

## 2022-03-06 DIAGNOSIS — Z13820 Encounter for screening for osteoporosis: Secondary | ICD-10-CM

## 2022-03-11 DIAGNOSIS — M25662 Stiffness of left knee, not elsewhere classified: Secondary | ICD-10-CM | POA: Diagnosis not present

## 2022-03-14 ENCOUNTER — Telehealth: Payer: Self-pay | Admitting: Pharmacy Technician

## 2022-03-14 DIAGNOSIS — M25662 Stiffness of left knee, not elsewhere classified: Secondary | ICD-10-CM | POA: Diagnosis not present

## 2022-03-14 DIAGNOSIS — Z596 Low income: Secondary | ICD-10-CM

## 2022-03-14 NOTE — Progress Notes (Signed)
[name] [facility])                                            [place] Team    03/14/2022  [name] 1955-10-20 [id number]  Received both patient and provider portion(s) of patient assistance application(s) for Trulicity. Faxed completed application and required documents into [employer].  [name] P. [name], [place]  [phone]

## 2022-03-18 DIAGNOSIS — M25662 Stiffness of left knee, not elsewhere classified: Secondary | ICD-10-CM | POA: Diagnosis not present

## 2022-03-20 DIAGNOSIS — M25662 Stiffness of left knee, not elsewhere classified: Secondary | ICD-10-CM | POA: Diagnosis not present

## 2022-03-25 DIAGNOSIS — M25662 Stiffness of left knee, not elsewhere classified: Secondary | ICD-10-CM | POA: Diagnosis not present

## 2022-03-27 ENCOUNTER — Other Ambulatory Visit: Payer: Self-pay | Admitting: Family Medicine

## 2022-03-27 DIAGNOSIS — Z1231 Encounter for screening mammogram for malignant neoplasm of breast: Secondary | ICD-10-CM

## 2022-03-27 DIAGNOSIS — M25662 Stiffness of left knee, not elsewhere classified: Secondary | ICD-10-CM | POA: Diagnosis not present

## 2022-03-31 ENCOUNTER — Telehealth: Payer: Self-pay | Admitting: Pharmacy Technician

## 2022-03-31 DIAGNOSIS — Z596 Low income: Secondary | ICD-10-CM

## 2022-03-31 NOTE — Progress Notes (Signed)
[facility])                                            [place] Team    03/31/2022  [name] 16-Nov-1955 [id number]  Care coordination call placed to [place] in regard to Trulicity application.  Spoke to [employer] who informs patient is APPROVED 03/11/22-03/10/23. Order will auto fill and ship to patient's home based on last fill date in 2023. Patient may call [employer] at [phone] if shipment has not arrived and patient does not have sufficient supply.  Brinton Brandel P. Eppie Barhorst, [place]  [phone]

## 2022-04-01 DIAGNOSIS — M25662 Stiffness of left knee, not elsewhere classified: Secondary | ICD-10-CM | POA: Diagnosis not present

## 2022-04-03 DIAGNOSIS — M25662 Stiffness of left knee, not elsewhere classified: Secondary | ICD-10-CM | POA: Diagnosis not present

## 2022-04-08 DIAGNOSIS — M25662 Stiffness of left knee, not elsewhere classified: Secondary | ICD-10-CM | POA: Diagnosis not present

## 2022-05-01 DIAGNOSIS — D1801 Hemangioma of skin and subcutaneous tissue: Secondary | ICD-10-CM | POA: Diagnosis not present

## 2022-05-01 DIAGNOSIS — L905 Scar conditions and fibrosis of skin: Secondary | ICD-10-CM | POA: Diagnosis not present

## 2022-05-01 DIAGNOSIS — L814 Other melanin hyperpigmentation: Secondary | ICD-10-CM | POA: Diagnosis not present

## 2022-05-01 DIAGNOSIS — M7542 Impingement syndrome of left shoulder: Secondary | ICD-10-CM | POA: Diagnosis not present

## 2022-05-01 DIAGNOSIS — L821 Other seborrheic keratosis: Secondary | ICD-10-CM | POA: Diagnosis not present

## 2022-05-07 DIAGNOSIS — H40013 Open angle with borderline findings, low risk, bilateral: Secondary | ICD-10-CM | POA: Diagnosis not present

## 2022-05-22 DIAGNOSIS — M25551 Pain in right hip: Secondary | ICD-10-CM | POA: Diagnosis not present

## 2022-05-22 DIAGNOSIS — L989 Disorder of the skin and subcutaneous tissue, unspecified: Secondary | ICD-10-CM | POA: Diagnosis not present

## 2022-06-06 ENCOUNTER — Other Ambulatory Visit: Payer: Medicare HMO

## 2022-06-20 ENCOUNTER — Other Ambulatory Visit: Payer: Medicare HMO

## 2022-06-20 ENCOUNTER — Ambulatory Visit
Admission: RE | Admit: 2022-06-20 | Discharge: 2022-06-20 | Disposition: A | Discharge status: Home / Self Care | Payer: Medicare HMO | Source: Ambulatory Visit | Attending: Family Medicine | Admitting: Family Medicine

## 2022-06-20 DIAGNOSIS — Z1231 Encounter for screening mammogram for malignant neoplasm of breast: Secondary | ICD-10-CM

## 2022-08-22 DIAGNOSIS — E782 Mixed hyperlipidemia: Secondary | ICD-10-CM | POA: Diagnosis not present

## 2022-08-22 DIAGNOSIS — I10 Essential (primary) hypertension: Secondary | ICD-10-CM | POA: Diagnosis not present

## 2022-08-22 DIAGNOSIS — E0590 Thyrotoxicosis, unspecified without thyrotoxic crisis or storm: Secondary | ICD-10-CM | POA: Diagnosis not present

## 2022-08-25 DIAGNOSIS — M25512 Pain in left shoulder: Secondary | ICD-10-CM | POA: Diagnosis not present

## 2022-10-08 DIAGNOSIS — H524 Presbyopia: Secondary | ICD-10-CM | POA: Diagnosis not present

## 2022-11-19 ENCOUNTER — Ambulatory Visit
Admission: RE | Admit: 2022-11-19 | Discharge: 2022-11-19 | Disposition: A | Discharge status: Home / Self Care | Payer: Medicare HMO | Source: Ambulatory Visit | Attending: Family Medicine | Admitting: Family Medicine

## 2022-11-19 DIAGNOSIS — E349 Endocrine disorder, unspecified: Secondary | ICD-10-CM | POA: Diagnosis not present

## 2022-11-19 DIAGNOSIS — N958 Other specified menopausal and perimenopausal disorders: Secondary | ICD-10-CM | POA: Diagnosis not present

## 2022-11-19 DIAGNOSIS — M8588 Other specified disorders of bone density and structure, other site: Secondary | ICD-10-CM | POA: Diagnosis not present

## 2022-11-19 DIAGNOSIS — Z13820 Encounter for screening for osteoporosis: Secondary | ICD-10-CM

## 2022-11-25 DIAGNOSIS — M25512 Pain in left shoulder: Secondary | ICD-10-CM | POA: Diagnosis not present

## 2023-05-11 DIAGNOSIS — R197 Diarrhea, unspecified: Secondary | ICD-10-CM | POA: Diagnosis not present

## 2023-05-15 DIAGNOSIS — K838 Other specified diseases of biliary tract: Secondary | ICD-10-CM | POA: Diagnosis not present

## 2023-05-15 DIAGNOSIS — K760 Fatty (change of) liver, not elsewhere classified: Secondary | ICD-10-CM | POA: Diagnosis not present

## 2023-05-15 DIAGNOSIS — K8020 Calculus of gallbladder without cholecystitis without obstruction: Secondary | ICD-10-CM | POA: Diagnosis not present

## 2023-05-19 DIAGNOSIS — H401131 Primary open-angle glaucoma, bilateral, mild stage: Secondary | ICD-10-CM | POA: Diagnosis not present

## 2023-05-22 ENCOUNTER — Other Ambulatory Visit (HOSPITAL_COMMUNITY): Payer: Self-pay | Admitting: Family Medicine

## 2023-05-22 DIAGNOSIS — K838 Other specified diseases of biliary tract: Secondary | ICD-10-CM

## 2023-05-25 DIAGNOSIS — M7552 Bursitis of left shoulder: Secondary | ICD-10-CM | POA: Diagnosis not present

## 2023-05-25 DIAGNOSIS — S43432A Superior glenoid labrum lesion of left shoulder, initial encounter: Secondary | ICD-10-CM | POA: Diagnosis not present

## 2023-05-25 DIAGNOSIS — Y999 Unspecified external cause status: Secondary | ICD-10-CM | POA: Diagnosis not present

## 2023-05-25 DIAGNOSIS — S46012A Strain of muscle(s) and tendon(s) of the rotator cuff of left shoulder, initial encounter: Secondary | ICD-10-CM | POA: Diagnosis not present

## 2023-05-25 DIAGNOSIS — S4382XA Sprain of other specified parts of left shoulder girdle, initial encounter: Secondary | ICD-10-CM | POA: Diagnosis not present

## 2023-05-25 DIAGNOSIS — X58XXXA Exposure to other specified factors, initial encounter: Secondary | ICD-10-CM | POA: Diagnosis not present

## 2023-05-25 DIAGNOSIS — M7542 Impingement syndrome of left shoulder: Secondary | ICD-10-CM | POA: Diagnosis not present

## 2023-05-25 DIAGNOSIS — G8918 Other acute postprocedural pain: Secondary | ICD-10-CM | POA: Diagnosis not present

## 2023-05-26 ENCOUNTER — Other Ambulatory Visit: Payer: Self-pay | Admitting: Family Medicine

## 2023-05-26 DIAGNOSIS — Z0000 Encounter for general adult medical examination without abnormal findings: Secondary | ICD-10-CM

## 2023-06-11 DIAGNOSIS — M25512 Pain in left shoulder: Secondary | ICD-10-CM | POA: Diagnosis not present

## 2023-06-16 DIAGNOSIS — M25512 Pain in left shoulder: Secondary | ICD-10-CM | POA: Diagnosis not present

## 2023-06-19 ENCOUNTER — Ambulatory Visit (HOSPITAL_BASED_OUTPATIENT_CLINIC_OR_DEPARTMENT_OTHER)
Admission: RE | Admit: 2023-06-19 | Discharge: 2023-06-19 | Disposition: A | Discharge status: Home / Self Care | Source: Ambulatory Visit | Attending: Family Medicine | Admitting: Family Medicine

## 2023-06-19 DIAGNOSIS — K838 Other specified diseases of biliary tract: Secondary | ICD-10-CM | POA: Insufficient documentation

## 2023-06-19 DIAGNOSIS — K8020 Calculus of gallbladder without cholecystitis without obstruction: Secondary | ICD-10-CM | POA: Diagnosis not present

## 2023-06-19 MED ORDER — GADOBUTROL 1 MMOL/ML IV SOLN
5.2000 mL | Freq: Once | INTRAVENOUS | Status: AC | PRN
  Administered 2023-06-19: 5.2 mL via INTRAVENOUS
  Filled 2023-06-19: qty 6

## 2023-06-24 DIAGNOSIS — M25512 Pain in left shoulder: Secondary | ICD-10-CM | POA: Diagnosis not present

## 2023-06-29 DIAGNOSIS — M25512 Pain in left shoulder: Secondary | ICD-10-CM | POA: Diagnosis not present

## 2023-07-09 ENCOUNTER — Ambulatory Visit

## 2023-07-10 DIAGNOSIS — M25512 Pain in left shoulder: Secondary | ICD-10-CM | POA: Diagnosis not present

## 2023-07-14 DIAGNOSIS — M25512 Pain in left shoulder: Secondary | ICD-10-CM | POA: Diagnosis not present

## 2023-07-15 ENCOUNTER — Ambulatory Visit
Admission: RE | Admit: 2023-07-15 | Discharge: 2023-07-15 | Disposition: A | Discharge status: Home / Self Care | Source: Ambulatory Visit | Attending: Family Medicine | Admitting: Family Medicine

## 2023-07-15 DIAGNOSIS — Z0000 Encounter for general adult medical examination without abnormal findings: Secondary | ICD-10-CM

## 2023-07-15 DIAGNOSIS — Z1231 Encounter for screening mammogram for malignant neoplasm of breast: Secondary | ICD-10-CM | POA: Diagnosis not present

## 2023-07-16 DIAGNOSIS — M25512 Pain in left shoulder: Secondary | ICD-10-CM | POA: Diagnosis not present

## 2023-07-21 DIAGNOSIS — M25512 Pain in left shoulder: Secondary | ICD-10-CM | POA: Diagnosis not present

## 2023-07-23 DIAGNOSIS — M25512 Pain in left shoulder: Secondary | ICD-10-CM | POA: Diagnosis not present

## 2023-07-28 DIAGNOSIS — M25512 Pain in left shoulder: Secondary | ICD-10-CM | POA: Diagnosis not present

## 2023-07-31 DIAGNOSIS — M25512 Pain in left shoulder: Secondary | ICD-10-CM | POA: Diagnosis not present

## 2023-08-04 DIAGNOSIS — K838 Other specified diseases of biliary tract: Secondary | ICD-10-CM | POA: Diagnosis not present

## 2023-08-06 DIAGNOSIS — M25512 Pain in left shoulder: Secondary | ICD-10-CM | POA: Diagnosis not present

## 2023-08-10 DIAGNOSIS — K838 Other specified diseases of biliary tract: Secondary | ICD-10-CM | POA: Diagnosis not present

## 2023-08-11 DIAGNOSIS — M25512 Pain in left shoulder: Secondary | ICD-10-CM | POA: Diagnosis not present

## 2023-08-19 DIAGNOSIS — M25512 Pain in left shoulder: Secondary | ICD-10-CM | POA: Diagnosis not present

## 2023-09-09 DIAGNOSIS — E782 Mixed hyperlipidemia: Secondary | ICD-10-CM | POA: Diagnosis not present

## 2023-09-09 DIAGNOSIS — Z6820 Body mass index (BMI) 20.0-20.9, adult: Secondary | ICD-10-CM | POA: Diagnosis not present

## 2023-09-09 DIAGNOSIS — I10 Essential (primary) hypertension: Secondary | ICD-10-CM | POA: Diagnosis not present

## 2023-09-09 DIAGNOSIS — E1136 Type 2 diabetes mellitus with diabetic cataract: Secondary | ICD-10-CM | POA: Diagnosis not present

## 2023-10-27 ENCOUNTER — Other Ambulatory Visit: Payer: Self-pay | Admitting: Gastroenterology

## 2023-11-18 ENCOUNTER — Encounter (HOSPITAL_COMMUNITY): Payer: Self-pay | Admitting: Gastroenterology

## 2023-11-24 DIAGNOSIS — H401131 Primary open-angle glaucoma, bilateral, mild stage: Secondary | ICD-10-CM | POA: Diagnosis not present

## 2023-11-24 NOTE — Anesthesia Preprocedure Evaluation (Signed)
 Anesthesia Evaluation  Patient identified by MRN, date of birth, ID band Patient awake    Reviewed: Allergy & Precautions, H&P , NPO status , Patient's Chart, lab work & pertinent test results  Airway Mallampati: II  TM Distance: >3 FB Neck ROM: Full    Dental  (+) Poor Dentition, Dental Advisory Given, Missing, Chipped   Pulmonary neg pulmonary ROS   Pulmonary exam normal breath sounds clear to auscultation       Cardiovascular Exercise Tolerance: Good hypertension, Pt. on medications Normal cardiovascular exam Rhythm:Regular Rate:Normal     Neuro/Psych negative neurological ROS  negative psych ROS   GI/Hepatic Neg liver ROS,GERD  Medicated and Controlled,,  Endo/Other  diabetes, Type 2    Renal/GU negative Renal ROS     Musculoskeletal  (+) Arthritis , Osteoarthritis,    Abdominal   Peds  Hematology negative hematology ROS (+)   Anesthesia Other Findings   Reproductive/Obstetrics                              Anesthesia Physical Anesthesia Plan  ASA: 2  Anesthesia Plan: MAC   Post-op Pain Management: Minimal or no pain anticipated   Induction: Intravenous  PONV Risk Score and Plan: 3 and Dexamethasone , Propofol  infusion, Treatment may vary due to age or medical condition and TIVA  Airway Management Planned: Natural Airway and Simple Face Mask  Additional Equipment:   Intra-op Plan:   Post-operative Plan:   Informed Consent: I have reviewed the patients History and Physical, chart, labs and discussed the procedure including the risks, benefits and alternatives for the proposed anesthesia with the patient or authorized representative who has indicated his/her understanding and acceptance.     Dental advisory given  Plan Discussed with: CRNA  Anesthesia Plan Comments:         Anesthesia Quick Evaluation

## 2023-11-25 ENCOUNTER — Encounter (HOSPITAL_COMMUNITY)
Admission: RE | Disposition: A | Discharge status: Home / Self Care | Payer: Self-pay | Source: Home / Self Care | Attending: Gastroenterology

## 2023-11-25 ENCOUNTER — Ambulatory Visit (HOSPITAL_COMMUNITY)
Admission: RE | Admit: 2023-11-25 | Discharge: 2023-11-25 | Disposition: A | Discharge status: Home / Self Care | Attending: Gastroenterology | Admitting: Gastroenterology

## 2023-11-25 ENCOUNTER — Ambulatory Visit (HOSPITAL_COMMUNITY): Payer: Self-pay | Admitting: Anesthesiology

## 2023-11-25 ENCOUNTER — Encounter (HOSPITAL_COMMUNITY): Payer: Self-pay | Admitting: Anesthesiology

## 2023-11-25 ENCOUNTER — Other Ambulatory Visit: Payer: Self-pay

## 2023-11-25 DIAGNOSIS — E119 Type 2 diabetes mellitus without complications: Secondary | ICD-10-CM

## 2023-11-25 DIAGNOSIS — K8020 Calculus of gallbladder without cholecystitis without obstruction: Secondary | ICD-10-CM

## 2023-11-25 DIAGNOSIS — Z79899 Other long term (current) drug therapy: Secondary | ICD-10-CM | POA: Diagnosis not present

## 2023-11-25 DIAGNOSIS — Z7982 Long term (current) use of aspirin: Secondary | ICD-10-CM | POA: Insufficient documentation

## 2023-11-25 DIAGNOSIS — K219 Gastro-esophageal reflux disease without esophagitis: Secondary | ICD-10-CM | POA: Diagnosis not present

## 2023-11-25 DIAGNOSIS — Z7985 Long-term (current) use of injectable non-insulin antidiabetic drugs: Secondary | ICD-10-CM | POA: Insufficient documentation

## 2023-11-25 DIAGNOSIS — I10 Essential (primary) hypertension: Secondary | ICD-10-CM | POA: Diagnosis not present

## 2023-11-25 DIAGNOSIS — R932 Abnormal findings on diagnostic imaging of liver and biliary tract: Secondary | ICD-10-CM | POA: Diagnosis not present

## 2023-11-25 DIAGNOSIS — K838 Other specified diseases of biliary tract: Secondary | ICD-10-CM | POA: Diagnosis not present

## 2023-11-25 DIAGNOSIS — M1990 Unspecified osteoarthritis, unspecified site: Secondary | ICD-10-CM | POA: Diagnosis not present

## 2023-11-25 HISTORY — PX: ESOPHAGOGASTRODUODENOSCOPY: SHX5428

## 2023-11-25 HISTORY — PX: EUS: SHX5427

## 2023-11-25 LAB — GLUCOSE, CAPILLARY: Glucose-Capillary: 109 mg/dL — ABNORMAL HIGH (ref 70–99)

## 2023-11-25 SURGERY — ULTRASOUND, UPPER GI TRACT, ENDOSCOPIC
Anesthesia: Monitor Anesthesia Care

## 2023-11-25 MED ORDER — GLYCOPYRROLATE PF 0.2 MG/ML IJ SOSY
PREFILLED_SYRINGE | INTRAMUSCULAR | Status: DC | PRN
Start: 2023-11-25 — End: 2023-11-25
  Administered 2023-11-25: .2 mg via INTRAVENOUS

## 2023-11-25 MED ORDER — PROPOFOL 10 MG/ML IV BOLUS
INTRAVENOUS | Status: AC
  Filled 2023-11-25: qty 20

## 2023-11-25 MED ORDER — PROPOFOL 1000 MG/100ML IV EMUL
INTRAVENOUS | Status: AC
  Filled 2023-11-25: qty 100

## 2023-11-25 MED ORDER — PROPOFOL 500 MG/50ML IV EMUL
INTRAVENOUS | Status: DC | PRN
  Administered 2023-11-25: 50 mg via INTRAVENOUS
  Administered 2023-11-25: 100 ug/kg/min via INTRAVENOUS
  Administered 2023-11-25: 100 mg via INTRAVENOUS

## 2023-11-25 MED ORDER — LIDOCAINE 2% (20 MG/ML) 5 ML SYRINGE
INTRAMUSCULAR | Status: DC | PRN
Start: 2023-11-25 — End: 2023-11-25
  Administered 2023-11-25: 100 mg via INTRAVENOUS

## 2023-11-25 MED ORDER — SODIUM CHLORIDE 0.9 % IV SOLN: INTRAVENOUS | Status: DC

## 2023-11-25 NOTE — Discharge Instructions (Signed)
YOU HAD AN ENDOSCOPIC PROCEDURE TODAY: Refer to the procedure report and other information in the discharge instructions given to you for any specific questions about what was found during the examination. If this information does not answer your questions, please call [facility] office at [phone] to clarify.   YOU SHOULD EXPECT: Some feelings of bloating in the abdomen. Passage of more gas than usual. Walking can help get rid of the air that was put into your GI tract during the procedure and reduce the bloating. If you had a lower endoscopy (such as a colonoscopy or flexible sigmoidoscopy) you may notice spotting of blood in your stool or on the toilet paper. Some abdominal soreness may be present for a day or two, also.  DIET: Your first meal following the procedure should be a light meal and then it is ok to progress to your normal diet. A half-sandwich or bowl of soup is an example of a good first meal. Heavy or fried foods are harder to digest and may make you feel nauseous or bloated. Drink plenty of fluids but you should avoid alcoholic beverages for 24 hours. If you had a esophageal dilation, please see attached instructions for diet.    ACTIVITY: Your care partner should take you home directly after the procedure. You should plan to take it easy, moving slowly for the rest of the day. You can resume normal activity the day after the procedure however YOU SHOULD NOT DRIVE, use power tools, machinery or perform tasks that involve climbing or major physical exertion for 24 hours (because of the sedation medicines used during the test).   SYMPTOMS TO REPORT IMMEDIATELY: A gastroenterologist can be reached at any hour. Please call [phone]  for any of the following symptoms:  Following upper endoscopy (EGD, EUS, ERCP, esophageal dilation) Vomiting of blood or coffee ground material  New, significant abdominal pain  New, significant chest pain or pain under the shoulder blades  Painful or  persistently difficult swallowing  New shortness of breath  Black, tarry-looking or red, bloody stools  FOLLOW UP:  If any biopsies were taken you will be contacted by phone or by letter within the next 1-3 weeks. Call [phone]  if you have not heard about the biopsies in 3 weeks.  Please also call with any specific questions about appointments or follow up tests. YOU HAD AN ENDOSCOPIC PROCEDURE TODAY: Refer to the procedure report and other information in the discharge instructions given to you for any specific questions about what was found during the examination. If this information does not answer your questions, please call [facility] office at [phone] to clarify.   

## 2023-11-25 NOTE — Op Note (Signed)
 [facility] Patient Name: [name] Procedure Date: 11/25/2023 MRN: [id number] Attending MD: Elsie Cree , MD, [id number] Date of Birth: 04-Oct-1955 CSN: [id number] Age: 68 Admit Type: Outpatient Procedure:                Upper EUS Indications:              Common bile duct dilation (acquired) seen on MRCP Providers:                Elsie Cree, MD, Darleene Bare, RN, [facility]                            [place], Technician, Rachel Crowder,CRNA Referring MD:             Dr. Layla Lah Medicines:                Monitored Anesthesia Care Complications:            No immediate complications. Estimated Blood Loss:     Estimated blood loss: none. Procedure:                Pre-Anesthesia Assessment:                           - Prior to the procedure, a History and Physical                            was performed, and patient medications and                            allergies were reviewed. The patient's tolerance of                            previous anesthesia was also reviewed. The risks                            and benefits of the procedure and the sedation                            options and risks were discussed with the patient.                            All questions were answered, and informed consent                            was obtained. Prior Anticoagulants: The patient has                            taken no anticoagulant or antiplatelet agents. ASA                            Grade Assessment: II - A patient with mild systemic                            disease. After reviewing the risks and benefits,  the patient was deemed in satisfactory condition to                            undergo the procedure.                           After obtaining informed consent, the endoscope was                            passed under direct vision. Throughout the                            procedure, the patient's blood  pressure, pulse, and                            oxygen saturations were monitored continuously. The                            RADIAL EUS ( [id number] ) [id number] was introduced                            through the mouth, and advanced to the second part                            of duodenum. The upper EUS was accomplished without                            difficulty. The patient tolerated the procedure                            well. Scope In: Scope Out: Findings:      ENDOSONOGRAPHIC FINDING: :      There was no sign of significant endosonographic abnormality in the       ampulla.      Many stones were visualized endosonographically in the gallbladder. The       stones were triangular.      There was dilation in the common bile duct which measured up to 10 mm.      No lymphadenopathy seen.      There was no sign of significant endosonographic abnormality in the left       lobe of the liver.      There was no sign of significant endosonographic abnormality in the       pancreatic head, genu of the pancreas, pancreatic body, pancreatic tail       and uncinate process of the pancreas. No masses. Impression:               - There was no sign of significant pathology in the                            ampulla.                           - Many stones were visualized endosonographically  in the gallbladder.                           - There was dilation in the common bile duct which                            measured up to 10 mm.                           - There was no evidence of significant pathology in                            the left lobe of the liver.                           - There was no sign of significant pathology in the                            pancreatic head, genu of the pancreas, pancreatic                            body, pancreatic tail and uncinate process of the                            pancreas.                           - No  specimens collected. Moderate Sedation:      Not Applicable - Patient had care per Anesthesia. Recommendation:           - Discharge patient to home (via wheelchair).                           - Resume previous diet today.                           - Continue present medications.                           - Return to GI clinic at appointment to be                            scheduled. Patient is asymptomatic and has normal                            LFTs. In absence of new/interval symptoms, I do not                            think any further testing or work-up is needed for                            her dilated bile duct. Procedure Code(s):        --- Professional ---  [phone], Esophagogastroduodenoscopy, flexible,                            transoral; with endoscopic ultrasound examination,                            including the esophagus, stomach, and either the                            duodenum or a surgically altered stomach where the                            jejunum is examined distal to the anastomosis Diagnosis Code(s):        --- Professional ---                           K80.20, Calculus of gallbladder without                            cholecystitis without obstruction                           K83.8, Other specified diseases of biliary tract CPT copyright 2022 [employer]. All rights reserved. The codes documented in this report are preliminary and upon coder review may  be revised to meet current compliance requirements. Elsie Cree, MD 11/25/2023 9:42:10 AM This report has been signed electronically. Number of Addenda: 0

## 2023-11-25 NOTE — H&P (Signed)
 [facility] H/P Note  Chief Complaint: dilated bile duct  HPI: [name] is an 68 y.o. female.  Here dilated bile duct on imaging.  No abdominal pain, jaundice, nausea/vomiting, weight loss.  Normal LFTs.  Past Medical History:  Diagnosis Date   Arthritis    arhtritis back and hands   Diabetes mellitus without complication (HCC)    GERD (gastroesophageal reflux disease)    Hypertension    Leg cramps, sleep related    bi lat severe    Past Surgical History:  Procedure Laterality Date   FOOT SURGERY Left    bunion with retained pin   LUMBAR LAMINECTOMY/DECOMPRESSION MICRODISCECTOMY Right 03/01/2014   Procedure: MICRO LUMBAR DECOMPRESSION L3 - L4 and L4 - L5 ON THE RIGHT  2 LEVELS;  Surgeon: Reyes JAYSON Billing, MD;  Location: [facility];  Service: Orthopedics;  Laterality: Right;   LUMBAR LAMINECTOMY/DECOMPRESSION MICRODISCECTOMY Left 03/18/2017   Procedure: Revision Microlumbar decompression L5-S1 left;  Surgeon: Billing Reyes, MD;  Location: [facility];  Service: Orthopedics;  Laterality: Left;  120 mins   TONSILLECTOMY     as a child   TOTAL KNEE ARTHROPLASTY Left 02/05/2022   Procedure: TOTAL KNEE ARTHROPLASTY;  Surgeon: Billing Reyes, MD;  Location: [facility];  Service: Orthopedics;  Laterality: Left;   TUBAL LIGATION  1980    Medications Prior to Admission  Medication Sig Dispense Refill   acetaminophen  (TYLENOL ) 500 MG tablet Take 2,000 mg by mouth daily as needed for moderate pain.      benazepril -hydrochlorthiazide (LOTENSIN  HCT) 10-12.5 MG per tablet Take 1 tablet by mouth every morning.     docusate sodium  (COLACE) 100 MG capsule Take 1 capsule (100 mg total) by mouth 2 (two) times daily as needed for mild constipation. 30 capsule 1   gabapentin  (NEURONTIN ) 100 MG capsule Take 1 capsule (100 mg total) by mouth 3 (three) times daily. 90 capsule 0   HYDROmorphone  (DILAUDID ) 2 MG tablet Take 1-2 tablets (2-4 mg total) by mouth every 3 (three) hours as needed for  severe pain. 40 tablet 0   latanoprost  (XALATAN ) 0.005 % ophthalmic solution Place 1 drop into both eyes at bedtime.  6   pantoprazole  (PROTONIX ) 40 MG tablet Take 40 mg by mouth every evening.     aspirin  EC 81 MG tablet Take 1 tablet (81 mg total) by mouth 2 (two) times daily after a meal. Day after surgery 60 tablet 1   Dulaglutide (TRULICITY) 0.75 MG/0.5ML SOPN Inject 0.75 mg into the skin every Sunday.     polyethylene glycol (MIRALAX  / GLYCOLAX ) 17 g packet Take 17 g by mouth daily. 14 each 0    Allergies: No Known Allergies  Family History  Problem Relation Age of Onset   Breast cancer Neg Hx     Social History:  reports that she has never smoked. She has never used smokeless tobacco. She reports current alcohol use. She reports that she does not use drugs.   ROS:As per HPI, all others negative   Blood pressure (!) 184/60, pulse 78, temperature (!) 97.4 F (36.3 C), temperature source Temporal, resp. rate 13, height 5' 3 (1.6 m), weight 52.2 kg, SpO2 100%. General appearance: Thin but not cachectic HEENT:  [place]/AT, anicteric LUNGS:  No visible distress CV:  No tachycardia ABD:  Soft, non-tender NEURO:  No encephalopathy  Results for orders placed or performed during the hospital encounter of 11/25/23 (from the past 48 hours)  Glucose, capillary     Status: Abnormal  Collection Time: 11/25/23  8:18 AM  Result Value Ref Range   Glucose-Capillary 109 (H) 70 - 99 mg/dL    Comment: Glucose reference range applies only to samples taken after fasting for at least 8 hours.   No results found.  Assessment/Plan   Dilated bile duct Upper endoscopic ultrasound with possible fine needle aspiration (FNA). Risks (bleeding, infection, bowel perforation that could require surgery, sedation-related changes in cardiopulmonary systems), benefits (identification and possible treatment of source of symptoms, exclusion of certain causes of symptoms), and alternatives (watchful waiting,  radiographic imaging studies, empiric medical treatment) of upper endoscopy with ultrasound and possible fine needle aspiration (EUS +/- FNA) were explained to patient/family in detail and patient wishes to proceed.   [name] 11/25/2023, 9:10 AM

## 2023-11-25 NOTE — Anesthesia Procedure Notes (Signed)
 Procedure Name: MAC Date/Time: 11/25/2023 9:17 AM  Performed by: Nada Corean CROME, CRNAPre-anesthesia Checklist: Patient identified, Emergency Drugs available, Suction available, Patient being monitored and Timeout performed Patient Re-evaluated:Patient Re-evaluated prior to induction Oxygen Delivery Method: Simple face mask Preoxygenation: Pre-oxygenation with 100% oxygen Induction Type: IV induction Placement Confirmation: positive ETCO2 Dental Injury: Teeth and Oropharynx as per pre-operative assessment  Comments: POM mask used

## 2023-11-25 NOTE — Transfer of Care (Signed)
 Immediate Anesthesia Transfer of Care Note  Patient: [name]  Procedure(s) Performed: ULTRASOUND, UPPER GI TRACT, ENDOSCOPIC  Patient Location: Endoscopy Unit  Anesthesia Type:MAC  Level of Consciousness: awake, alert , oriented, and patient cooperative  Airway & Oxygen Therapy: Patient Spontanous Breathing and Patient connected to face mask oxygen  Post-op Assessment: Report given to RN and Post -op Vital signs reviewed and stable  Post vital signs: Reviewed and stable  Last Vitals:  Vitals Value Taken Time  BP 134/65 11/25/23 09:41  Temp    Pulse 83 11/25/23 09:41  Resp 14 11/25/23 09:41  SpO2 100 % 11/25/23 09:41    Last Pain:  Vitals:   11/25/23 0814  TempSrc: Temporal  PainSc: 0-No pain         Complications: No notable events documented.

## 2023-11-25 NOTE — Anesthesia Postprocedure Evaluation (Signed)
 Anesthesia Post Note  Patient: [name]  Procedure(s) Performed: ULTRASOUND, UPPER GI TRACT, ENDOSCOPIC     Patient location during evaluation: PACU Anesthesia Type: MAC Level of consciousness: awake and alert Pain management: pain level controlled Vital Signs Assessment: post-procedure vital signs reviewed and stable Respiratory status: spontaneous breathing Cardiovascular status: stable Anesthetic complications: no   No notable events documented.  Last Vitals:  Vitals:   11/25/23 0950 11/25/23 1000  BP: 137/78 (!) 150/79  Pulse: 78 77  Resp: 15 15  Temp:    SpO2: 99% 100%    Last Pain:  Vitals:   11/25/23 1000  TempSrc:   PainSc: 0-No pain                 Norleen Pope

## 2023-11-26 ENCOUNTER — Encounter (HOSPITAL_COMMUNITY): Payer: Self-pay | Admitting: Gastroenterology

## 2023-12-02 ENCOUNTER — Observation Stay (HOSPITAL_COMMUNITY)

## 2023-12-02 ENCOUNTER — Emergency Department (HOSPITAL_COMMUNITY)

## 2023-12-02 ENCOUNTER — Observation Stay (HOSPITAL_COMMUNITY)
Admission: EM | Admit: 2023-12-02 | Discharge: 2023-12-03 | Disposition: A | Discharge status: Home / Self Care | Attending: Internal Medicine | Admitting: Internal Medicine

## 2023-12-02 ENCOUNTER — Encounter (HOSPITAL_COMMUNITY): Payer: Self-pay

## 2023-12-02 ENCOUNTER — Other Ambulatory Visit: Payer: Self-pay

## 2023-12-02 DIAGNOSIS — I10 Essential (primary) hypertension: Secondary | ICD-10-CM | POA: Insufficient documentation

## 2023-12-02 DIAGNOSIS — W19XXXA Unspecified fall, initial encounter: Secondary | ICD-10-CM | POA: Insufficient documentation

## 2023-12-02 DIAGNOSIS — Z043 Encounter for examination and observation following other accident: Secondary | ICD-10-CM | POA: Diagnosis not present

## 2023-12-02 DIAGNOSIS — K8020 Calculus of gallbladder without cholecystitis without obstruction: Secondary | ICD-10-CM | POA: Diagnosis not present

## 2023-12-02 DIAGNOSIS — E119 Type 2 diabetes mellitus without complications: Secondary | ICD-10-CM | POA: Diagnosis not present

## 2023-12-02 DIAGNOSIS — M4712 Other spondylosis with myelopathy, cervical region: Secondary | ICD-10-CM | POA: Diagnosis not present

## 2023-12-02 DIAGNOSIS — R55 Syncope and collapse: Secondary | ICD-10-CM | POA: Diagnosis not present

## 2023-12-02 DIAGNOSIS — S2242XA Multiple fractures of ribs, left side, initial encounter for closed fracture: Secondary | ICD-10-CM | POA: Insufficient documentation

## 2023-12-02 DIAGNOSIS — Z794 Long term (current) use of insulin: Secondary | ICD-10-CM | POA: Insufficient documentation

## 2023-12-02 DIAGNOSIS — K838 Other specified diseases of biliary tract: Secondary | ICD-10-CM | POA: Diagnosis not present

## 2023-12-02 DIAGNOSIS — I651 Occlusion and stenosis of basilar artery: Secondary | ICD-10-CM | POA: Diagnosis not present

## 2023-12-02 DIAGNOSIS — I700 Atherosclerosis of aorta: Secondary | ICD-10-CM | POA: Diagnosis not present

## 2023-12-02 DIAGNOSIS — I671 Cerebral aneurysm, nonruptured: Secondary | ICD-10-CM | POA: Insufficient documentation

## 2023-12-02 DIAGNOSIS — I6782 Cerebral ischemia: Secondary | ICD-10-CM | POA: Diagnosis not present

## 2023-12-02 DIAGNOSIS — T07XXXA Unspecified multiple injuries, initial encounter: Secondary | ICD-10-CM | POA: Diagnosis not present

## 2023-12-02 DIAGNOSIS — S2239XA Fracture of one rib, unspecified side, initial encounter for closed fracture: Secondary | ICD-10-CM | POA: Diagnosis present

## 2023-12-02 DIAGNOSIS — R519 Headache, unspecified: Secondary | ICD-10-CM | POA: Diagnosis not present

## 2023-12-02 LAB — CBC WITH DIFFERENTIAL/PLATELET
Abs Immature Granulocytes: 0.06 K/uL (ref 0.00–0.07)
Basophils Absolute: 0.1 K/uL (ref 0.0–0.1)
Basophils Relative: 1 %
Eosinophils Absolute: 0.2 K/uL (ref 0.0–0.5)
Eosinophils Relative: 2 %
HCT: 39.2 % (ref 36.0–46.0)
Hemoglobin: 12.2 g/dL (ref 12.0–15.0)
Immature Granulocytes: 1 %
Lymphocytes Relative: 18 %
Lymphs Abs: 1.8 K/uL (ref 0.7–4.0)
MCH: 30.9 pg (ref 26.0–34.0)
MCHC: 31.1 g/dL (ref 30.0–36.0)
MCV: 99.2 fL (ref 80.0–100.0)
Monocytes Absolute: 0.5 K/uL (ref 0.1–1.0)
Monocytes Relative: 5 %
Neutro Abs: 7.3 K/uL (ref 1.7–7.7)
Neutrophils Relative %: 73 %
Platelets: 255 K/uL (ref 150–400)
RBC: 3.95 MIL/uL (ref 3.87–5.11)
RDW: 13.2 % (ref 11.5–15.5)
WBC: 10 K/uL (ref 4.0–10.5)
nRBC: 0 % (ref 0.0–0.2)

## 2023-12-02 LAB — BASIC METABOLIC PANEL WITH GFR
Anion gap: 12 (ref 5–15)
BUN: 22 mg/dL (ref 8–23)
CO2: 24 mmol/L (ref 22–32)
Calcium: 9.9 mg/dL (ref 8.9–10.3)
Chloride: 104 mmol/L (ref 98–111)
Creatinine, Ser: 0.92 mg/dL (ref 0.44–1.00)
GFR, Estimated: >60 mL/min (ref >60)
Glucose, Bld: 133 mg/dL — ABNORMAL HIGH (ref 70–99)
Potassium: 4.7 mmol/L (ref 3.5–5.1)
Sodium: 140 mmol/L (ref 135–145)

## 2023-12-02 LAB — GLUCOSE, CAPILLARY
Glucose-Capillary: 122 mg/dL — ABNORMAL HIGH (ref 70–99)
Glucose-Capillary: 184 mg/dL — ABNORMAL HIGH (ref 70–99)

## 2023-12-02 LAB — HEPATIC FUNCTION PANEL
ALT: 9 U/L (ref 0–44)
AST: 20 U/L (ref 15–41)
Albumin: 4.2 g/dL (ref 3.5–5.0)
Alkaline Phosphatase: 70 U/L (ref 38–126)
Bilirubin, Direct: 0.3 mg/dL — ABNORMAL HIGH (ref 0.0–0.2)
Indirect Bilirubin: 0.7 mg/dL (ref 0.3–0.9)
Total Bilirubin: 1 mg/dL (ref 0.0–1.2)
Total Protein: 6.3 g/dL — ABNORMAL LOW (ref 6.5–8.1)

## 2023-12-02 LAB — CBG MONITORING, ED: Glucose-Capillary: 120 mg/dL — ABNORMAL HIGH (ref 70–99)

## 2023-12-02 LAB — TROPONIN T, HIGH SENSITIVITY: Troponin T High Sensitivity: <15 ng/L (ref 0–19)

## 2023-12-02 MED ORDER — ACETAMINOPHEN 650 MG RE SUPP: 650.0000 mg | Freq: Four times a day (QID) | RECTAL | Status: DC | PRN

## 2023-12-02 MED ORDER — KETOROLAC TROMETHAMINE 15 MG/ML IJ SOLN
15.0000 mg | Freq: Four times a day (QID) | INTRAMUSCULAR | Status: DC | PRN
  Administered 2023-12-02 – 2023-12-03 (×3): 15 mg via INTRAVENOUS
  Filled 2023-12-02 (×3): qty 1

## 2023-12-02 MED ORDER — FENTANYL CITRATE PF 50 MCG/ML IJ SOSY
50.0000 ug | PREFILLED_SYRINGE | Freq: Once | INTRAMUSCULAR | Status: AC
  Administered 2023-12-02: 50 ug via INTRAVENOUS
  Filled 2023-12-02: qty 1

## 2023-12-02 MED ORDER — MORPHINE SULFATE (PF) 4 MG/ML IV SOLN
4.0000 mg | Freq: Once | INTRAVENOUS | Status: AC
  Administered 2023-12-02: 4 mg via INTRAVENOUS
  Filled 2023-12-02: qty 1

## 2023-12-02 MED ORDER — ENOXAPARIN SODIUM 40 MG/0.4ML IJ SOSY
40.0000 mg | PREFILLED_SYRINGE | Freq: Every day | INTRAMUSCULAR | Status: DC
Start: 2023-12-02 — End: 2023-12-03
  Administered 2023-12-02: 40 mg via SUBCUTANEOUS
  Filled 2023-12-02: qty 0.4

## 2023-12-02 MED ORDER — SODIUM CHLORIDE 0.9 % IV BOLUS
1000.0000 mL | Freq: Once | INTRAVENOUS | Status: AC
  Administered 2023-12-02: 1000 mL via INTRAVENOUS

## 2023-12-02 MED ORDER — INSULIN ASPART 100 UNIT/ML IJ SOLN
0.0000 [IU] | Freq: Three times a day (TID) | INTRAMUSCULAR | Status: DC
  Administered 2023-12-02 – 2023-12-03 (×2): 2 [IU] via SUBCUTANEOUS
  Filled 2023-12-02: qty 0.15

## 2023-12-02 MED ORDER — IBUPROFEN 200 MG PO TABS: 400.0000 mg | ORAL_TABLET | Freq: Four times a day (QID) | ORAL | Status: DC | PRN

## 2023-12-02 MED ORDER — METHOCARBAMOL 500 MG PO TABS
500.0000 mg | ORAL_TABLET | Freq: Three times a day (TID) | ORAL | Status: DC | PRN
  Administered 2023-12-02: 500 mg via ORAL
  Filled 2023-12-02: qty 1

## 2023-12-02 MED ORDER — ACETAMINOPHEN 325 MG PO TABS
650.0000 mg | ORAL_TABLET | Freq: Four times a day (QID) | ORAL | Status: DC | PRN
  Administered 2023-12-02 – 2023-12-03 (×3): 650 mg via ORAL
  Filled 2023-12-02 (×3): qty 2

## 2023-12-02 MED ORDER — ONDANSETRON HCL 4 MG/2ML IJ SOLN: 4.0000 mg | Freq: Four times a day (QID) | INTRAMUSCULAR | Status: DC | PRN

## 2023-12-02 MED ORDER — SODIUM CHLORIDE 0.9% FLUSH
3.0000 mL | Freq: Two times a day (BID) | INTRAVENOUS | Status: DC
Start: 2023-12-02 — End: 2023-12-03
  Administered 2023-12-02 – 2023-12-03 (×2): 3 mL via INTRAVENOUS

## 2023-12-02 MED ORDER — IOHEXOL 350 MG/ML SOLN
75.0000 mL | Freq: Once | INTRAVENOUS | Status: AC | PRN
  Administered 2023-12-02: 75 mL via INTRAVENOUS

## 2023-12-02 MED ORDER — ONDANSETRON HCL 4 MG PO TABS: 4.0000 mg | ORAL_TABLET | Freq: Four times a day (QID) | ORAL | Status: DC | PRN

## 2023-12-02 NOTE — ED Triage Notes (Signed)
 Pt reports blacking out on her way to the bathroom this morning, falling, hitting the left side of her head and her left ribs. Pt reports pain when taking a breath.

## 2023-12-02 NOTE — H&P (Addendum)
 [facility] History and Physical  [name] FMW:[id number] DOB: 1955/04/09 DOA: 12/02/2023   PCP: Crecencio Chiquita POUR, FNP  Specialists: Dr. Burnette is her gastroenterologist  Chief Complaint: Passing out episode  HPI: [name] is a 68 y.o. female with a past medical history of essential hypertension, gallstones, CBD dilatation status post recent EUS, diabetes mellitus type 2, history of glaucoma who underwent an EUS on 9/17 for common bile duct dilatation noted on MRCP in April.  Gallstones were incidentally seen on the EUS.  No other significant pathology was noted.  Patient returned back to her usual activities.  She mentions that she has a longstanding history of leg cramps particularly worse at nighttime.  Last night the leg cramps were significant.  She could not get comfortable in the bed.  She got up in the middle of the night to go to the bathroom and the next thing she remembers is being on the floor.  She called out to her significant other who helped her.  She was subsequently brought into the emergency department.  She denied any chest pain either prior to or since the episode of syncope.  No shortness of breath.  No weakness on any one side.  She has vision difficulties for which she is followed by ophthalmology.  No recent changes in her vision though.  No recent changes to her medications.  In the emergency department she was noted to be hemodynamically stable.  She underwent blood work, EKG.  She underwent CT angiogram of the chest which was negative for PE.  Left-sided rib fractures were noted.  Home Medications: This list is not reconciled yet. Prior to Admission medications   Medication Sig Start Date End Date Taking? Authorizing Provider  acetaminophen  (TYLENOL ) 500 MG tablet Take 2,000 mg by mouth daily as needed for moderate pain.     [provider]  benazepril -hydrochlorthiazide (LOTENSIN  HCT) 10-12.5 MG per tablet Take 1 tablet by mouth every  morning.    [provider]  docusate sodium  (COLACE) 100 MG capsule Take 1 capsule (100 mg total) by mouth 2 (two) times daily as needed for mild constipation. 02/05/22   Duwayne Purchase, MD  Dulaglutide (TRULICITY) 0.75 MG/0.5ML SOPN Inject 0.75 mg into the skin every Sunday.    [provider]  gabapentin  (NEURONTIN ) 100 MG capsule Take 1 capsule (100 mg total) by mouth 3 (three) times daily. 02/07/22   Bissell, Jaclyn M, PA-C  HYDROmorphone  (DILAUDID ) 2 MG tablet Take 1-2 tablets (2-4 mg total) by mouth every 3 (three) hours as needed for severe pain. 02/07/22   Bissell, Jaclyn M, PA-C  latanoprost  (XALATAN ) 0.005 % ophthalmic solution Place 1 drop into both eyes at bedtime. 01/22/17   [provider]  pantoprazole  (PROTONIX ) 40 MG tablet Take 40 mg by mouth every evening.    [provider]  polyethylene glycol (MIRALAX  / GLYCOLAX ) 17 g packet Take 17 g by mouth daily. 02/05/22   Duwayne Purchase, MD    Allergies:  Allergies  Allergen Reactions   Rosuvastatin Other (See Comments)    Leg cramps    Past Medical History: Past Medical History:  Diagnosis Date   Arthritis    arhtritis back and hands   Diabetes mellitus without complication (HCC)    GERD (gastroesophageal reflux disease)    Hypertension    Leg cramps, sleep related    bi lat severe    Past Surgical History:  Procedure Laterality Date   ESOPHAGOGASTRODUODENOSCOPY N/A 11/25/2023  Procedure: EGD (ESOPHAGOGASTRODUODENOSCOPY);  Surgeon: Burnette Fallow, MD;  Location: [facility];  Service: Gastroenterology;  Laterality: N/A;   EUS N/A 11/25/2023   Procedure: ULTRASOUND, UPPER GI TRACT, ENDOSCOPIC;  Surgeon: Burnette Fallow, MD;  Location: [facility];  Service: Gastroenterology;  Laterality: N/A;   FOOT SURGERY Left    bunion with retained pin   LUMBAR LAMINECTOMY/DECOMPRESSION MICRODISCECTOMY Right 03/01/2014   Procedure: MICRO LUMBAR DECOMPRESSION L3 - L4 and L4 - L5 ON THE RIGHT  2  LEVELS;  Surgeon: Reyes JAYSON Billing, MD;  Location: [facility];  Service: Orthopedics;  Laterality: Right;   LUMBAR LAMINECTOMY/DECOMPRESSION MICRODISCECTOMY Left 03/18/2017   Procedure: Revision Microlumbar decompression L5-S1 left;  Surgeon: Billing Reyes, MD;  Location: [facility];  Service: Orthopedics;  Laterality: Left;  120 mins   TONSILLECTOMY     as a child   TOTAL KNEE ARTHROPLASTY Left 02/05/2022   Procedure: TOTAL KNEE ARTHROPLASTY;  Surgeon: Billing Reyes, MD;  Location: [facility];  Service: Orthopedics;  Laterality: Left;   TUBAL LIGATION  1980    Social History: Denies smoking alcohol use or recreational drug use.  Independent with daily activities.  Family History:  Family History  Problem Relation Age of Onset   Cancer Mother    Heart disease Sister    Breast cancer Neg Hx      Review of Systems - History obtained from the patient General ROS: negative Psychological ROS: negative Ophthalmic ROS: positive for - blurry vision and glaucoma ENT ROS: negative Allergy and Immunology ROS: negative Hematological and Lymphatic ROS: negative Endocrine ROS: negative Respiratory ROS: no cough, shortness of breath, or wheezing Cardiovascular ROS: no chest pain or dyspnea on exertion Gastrointestinal ROS: no abdominal pain, change in bowel habits, or black or bloody stools Genito-Urinary ROS: no dysuria, trouble voiding, or hematuria Musculoskeletal ROS: negative Neurological ROS: no TIA or stroke symptoms Dermatological ROS: negative  Physical Examination  Vitals:   12/02/23 1045 12/02/23 1115 12/02/23 1230 12/02/23 1240  BP: (!) 158/81 (!) 151/80 125/66   Pulse: 70 74 65   Resp: 19 15 13    Temp:    98.6 F (37 C)  TempSrc:    Oral  SpO2: 100% 99% 100%     BP 125/66   Pulse 65   Temp 98.6 F (37 C) (Oral)   Resp 13   SpO2 100%   General appearance: alert, cooperative, appears stated age, and no distress Head: Normocephalic, without obvious abnormality,  atraumatic Eyes: conjunctivae/corneas clear. PERRL, EOM's intact.  Throat: lips, mucosa, and tongue normal; teeth and gums normal Neck: no adenopathy, no carotid bruit, no JVD, supple, symmetrical, trachea midline, and thyroid  not enlarged, symmetric, no tenderness/mass/nodules Resp: clear to auscultation bilaterally Cardio: regular rate and rhythm, S1, S2 normal, no murmur, click, rub or gallop GI: soft, non-tender; bowel sounds normal; no masses,  no organomegaly Extremities: extremities normal, atraumatic, no cyanosis or edema Pulses: 2+ and symmetric Skin: Skin color, texture, turgor normal. No rashes or lesions Lymph nodes: Cervical, supraclavicular, and axillary nodes normal. Neurologic: Alert and oriented x 3.  Cranial nerves II to XII intact.  Motor strength equal bilateral upper and lower extremities.   Labs on Admission: I have personally reviewed following labs and imaging studies  CBC: Recent Labs  Lab 12/02/23 0709  WBC 10.0  NEUTROABS 7.3  HGB 12.2  HCT 39.2  MCV 99.2  PLT 255   Basic Metabolic Panel: Recent Labs  Lab 12/02/23 0709  NA 140  K 4.7  CL  104  CO2 24  GLUCOSE 133*  BUN 22  CREATININE 0.92  CALCIUM 9.9   GFR: Estimated Creatinine Clearance: 47.6 mL/min (by C-G formula based on SCr of 0.92 mg/dL).  CBG: Recent Labs  Lab 12/02/23 0714  GLUCAP 120*     Radiological Exams on Admission: CT Angio Chest PE W and/or Wo Contrast Result Date: 12/02/2023 CLINICAL DATA:  Pulmonary embolism suspected, syncope, left-sided chest and rib pain EXAM: CT ANGIOGRAPHY CHEST WITH CONTRAST TECHNIQUE: Multidetector CT imaging of the chest was performed using the standard protocol during bolus administration of intravenous contrast. Multiplanar CT image reconstructions and MIPs were obtained to evaluate the vascular anatomy. RADIATION DOSE REDUCTION: This exam was performed according to the departmental dose-optimization program which includes automated exposure  control, adjustment of the mA and/or kV according to patient size and/or use of iterative reconstruction technique. CONTRAST:  75mL OMNIPAQUE  IOHEXOL  350 MG/ML SOLN COMPARISON:  Same day rib radiographs FINDINGS: Cardiovascular: Satisfactory opacification of the pulmonary arteries to the segmental level. No evidence of pulmonary embolism. No pericardial effusion. Mediastinum/Nodes: No lymphadenopathy. Lungs/Pleura: Mosaic attenuation of the lung bases may be related to of subsegmental atelectasis with air trapping. No focal consolidations. Upper Abdomen: Cholelithiasis. Musculoskeletal: Nondisplaced left anterior seventh rib fracture. There is also likely nondisplaced left anterior 6 and 8th rib fractures. Review of the MIP images confirms the above findings. IMPRESSION: 1.  No acute pulmonary embolism identified. 2.  Likely nondisplaced left anterior 6-8 rib fractures. 3.  Nonspecific air trapping in the bilateral lung bases. Electronically Signed   By: Michaeline Blanch M.D.   On: 12/02/2023 09:52   CT Head Wo Contrast Result Date: 12/02/2023 CLINICAL DATA:  Provided history: Polytrauma, blunt. EXAM: CT HEAD WITHOUT CONTRAST CT CERVICAL SPINE WITHOUT CONTRAST TECHNIQUE: Multidetector CT imaging of the head and cervical spine was performed following the standard protocol without intravenous contrast. Multiplanar CT image reconstructions of the cervical spine were also generated. RADIATION DOSE REDUCTION: This exam was performed according to the departmental dose-optimization program which includes automated exposure control, adjustment of the mA and/or kV according to patient size and/or use of iterative reconstruction technique. COMPARISON:  Head CT 05/20/2018. FINDINGS: CT HEAD FINDINGS Brain: Patchy and ill-defined hypoattenuation within the cerebral white matter, nonspecific but compatible with mild chronic small vessel ischemic disease. There is no acute intracranial hemorrhage. No demarcated cortical infarct.  No extra-axial fluid collection. No evidence of an intracranial mass. No midline shift. Vascular: No hyperdense vessel.  Atherosclerotic calcifications. Skull: No calvarial fracture or aggressive osseous lesion. Sinuses/Orbits: No mass or acute finding within the imaged orbits. Mild mucosal thickening or small mucous retention cyst within the left sphenoid sinus. CT CERVICAL SPINE FINDINGS Alignment: Nonspecific straightening of the expected cervical lordosis. 2 mm grade 1 anterolisthesis at C3-C4 and C7-T1. Skull base and vertebrae: The basion-dental and atlanto-dental intervals are maintained.No evidence of acute fracture to the cervical spine. Soft tissues and spinal canal: No prevertebral fluid or swelling. No visible canal hematoma. Disc levels: Cervical spondylosis with multilevel disc space narrowing, disc bulges/central disc protrusions, posterior disc osteophyte complexes, endplate spurring and uncovertebral hypertrophy. Disc space narrowing is greatest at C5-C6 and C6-C7 (moderate-to-advanced at these levels). Bilateral facet arthropathy at C7-T1. No appreciable high-grade spinal canal stenosis. Multilevel bony neural foraminal narrowing. Degenerative changes also present at the C1-C2 articulation. Upper chest: No consolidation within the imaged lung apices. No visible pneumothorax. IMPRESSION: CT head: 1. No evidence of an acute intracranial abnormality. 2. Mild chronic small vessel ischemic  changes within the cerebral white matter. CT cervical spine: 1. No evidence of acute cervical spine fracture. 2. Nonspecific straightening of the expected cervical lordosis. 3. 2 mm grade 1 anterolisthesis at C3-C4 and C7-T1. 4. Cervical spondylosis as described. Electronically Signed   By: Rockey Childs D.O.   On: 12/02/2023 09:33   CT Cervical Spine Wo Contrast Result Date: 12/02/2023 CLINICAL DATA:  Provided history: Polytrauma, blunt. EXAM: CT HEAD WITHOUT CONTRAST CT CERVICAL SPINE WITHOUT CONTRAST TECHNIQUE:  Multidetector CT imaging of the head and cervical spine was performed following the standard protocol without intravenous contrast. Multiplanar CT image reconstructions of the cervical spine were also generated. RADIATION DOSE REDUCTION: This exam was performed according to the departmental dose-optimization program which includes automated exposure control, adjustment of the mA and/or kV according to patient size and/or use of iterative reconstruction technique. COMPARISON:  Head CT 05/20/2018. FINDINGS: CT HEAD FINDINGS Brain: Patchy and ill-defined hypoattenuation within the cerebral white matter, nonspecific but compatible with mild chronic small vessel ischemic disease. There is no acute intracranial hemorrhage. No demarcated cortical infarct. No extra-axial fluid collection. No evidence of an intracranial mass. No midline shift. Vascular: No hyperdense vessel.  Atherosclerotic calcifications. Skull: No calvarial fracture or aggressive osseous lesion. Sinuses/Orbits: No mass or acute finding within the imaged orbits. Mild mucosal thickening or small mucous retention cyst within the left sphenoid sinus. CT CERVICAL SPINE FINDINGS Alignment: Nonspecific straightening of the expected cervical lordosis. 2 mm grade 1 anterolisthesis at C3-C4 and C7-T1. Skull base and vertebrae: The basion-dental and atlanto-dental intervals are maintained.No evidence of acute fracture to the cervical spine. Soft tissues and spinal canal: No prevertebral fluid or swelling. No visible canal hematoma. Disc levels: Cervical spondylosis with multilevel disc space narrowing, disc bulges/central disc protrusions, posterior disc osteophyte complexes, endplate spurring and uncovertebral hypertrophy. Disc space narrowing is greatest at C5-C6 and C6-C7 (moderate-to-advanced at these levels). Bilateral facet arthropathy at C7-T1. No appreciable high-grade spinal canal stenosis. Multilevel bony neural foraminal narrowing. Degenerative changes  also present at the C1-C2 articulation. Upper chest: No consolidation within the imaged lung apices. No visible pneumothorax. IMPRESSION: CT head: 1. No evidence of an acute intracranial abnormality. 2. Mild chronic small vessel ischemic changes within the cerebral white matter. CT cervical spine: 1. No evidence of acute cervical spine fracture. 2. Nonspecific straightening of the expected cervical lordosis. 3. 2 mm grade 1 anterolisthesis at C3-C4 and C7-T1. 4. Cervical spondylosis as described. Electronically Signed   By: Rockey Childs D.O.   On: 12/02/2023 09:33   DG Ribs Unilateral W/Chest Left Result Date: 12/02/2023 CLINICAL DATA:  Fall this morning from standing height onto left side. Possible left rib fracture. EXAM: LEFT RIBS AND CHEST - 3+ VIEW COMPARISON:  Chest x-ray 07/10/2014 FINDINGS: Lungs are adequately inflated and otherwise clear. Cardiomediastinal silhouette is normal. Possible subtle fracture of the anterior left tenth rib possible subtle anterolateral left sixth rib fracture. IMPRESSION: 1. No acute cardiopulmonary disease. 2. Possible subtle fractures of the anterior left tenth rib and anterolateral left sixth rib. Electronically Signed   By: Toribio Agreste M.D.   On: 12/02/2023 08:08    My interpretation of Electrocardiogram: Sinus rhythm in the 60s.  RBBB is noted.  No QT prolongation.  No concerning ST or T wave changes.  Similar to previous EKG.   Problem List  Principal Problem:   Syncope, vasovagal Active Problems:   Essential hypertension   Diabetes mellitus type 2 in nonobese [facility])   Closed rib fracture  Syncope   Assessment: This is a 68 year old female with past medical history as stated earlier who comes in for evaluation of syncope.  Her history is suggestive of vasovagal episode.  As a result of her syncope and fall she has sustained rib fractures.  No evidence for pulmonary contusion.  Do not suspect infection.  She denies any dysuria.  WBC is normal.  No  respiratory symptoms apart from that secondary to her rib fracture.  Plan:  #1. Syncope, most likely vasovagal: Rule out structural heart disease by obtaining echocardiogram.  EKG shows RBBB which is old compared to previous EKG.  Will monitor on telemetry to rule out arrhythmias.  She will benefit from a heart monitor at discharge.  PE has been ruled out.  Orthostatics will be checked.  PT and OT eval in the morning.  #2.  Left-sided rib fractures: No evidence of pulmonary contusion.  Saturating normal on room air.  Incentive spirometry.  Pain medications will be ordered.  #3. Essential hypertension: Monitor blood pressures.  Await for medication reconciliation  #4.  Diabetes mellitus type 2: Apparently on Trulicity.  Check CBGs.  SSI.  Not noted to be hypoglycemic.  Check HbA1c.  It was 5.9 in 2023.  #5.  CBD dilatation/cholelithiasis: Recently underwent EUS on 11/25/2023.  No acute findings were noted.  Incidental gallstones were seen.  Currently asymptomatic.  Will check LFTs.  #6.  Longstanding leg cramps: No swelling noted bilateral lower extremities.  No erythema.  Can use muscle relaxants as needed.  #7. Headaches. Patient with longstanding h/o headaches which have been worsening. CT without acute findings. Will proceed with MRI/MRA.   DVT Prophylaxis: Lovenox  Code Status: Full code Family Communication: With patient Disposition: Home when improved Consults called: None Admission Status: Observation for syncope evaluation    Severity of Illness: The appropriate patient status for this patient is OBSERVATION. Observation status is judged to be reasonable and necessary in order to provide the required intensity of service to ensure the patient's safety. The patient's presenting symptoms, physical exam findings, and initial radiographic and laboratory data in the context of their medical condition is felt to place them at decreased risk for further clinical deterioration.  Furthermore, it is anticipated that the patient will be medically stable for discharge from the hospital within 2 midnights of admission.    Further management decisions will depend on results of further testing and patient's response to treatment.   Tammy Wickliffe  [facility] Pager on [email or web address]  12/02/2023, 2:29 PM

## 2023-12-02 NOTE — ED Provider Notes (Signed)
 [place] EMERGENCY DEPARTMENT AT [facility] Provider Note   CSN: [id number] Arrival date & time: 12/02/23  9295     Patient presents with: [name]   [name] is a 68 y.o. female.   68 year old female presents today for concern of falling this morning as she attempted to walk over to the bathroom.  She has no idea of what had happened and what caused her fall.  There is no prodromal symptoms such as lightheadedness, warm sensation, nausea, chest pain, shortness of breath, or palpitations.  She states she was slightly away from where the bathroom was and is unsure how she might of ended up there.  Endorses left chest wall pain.  Otherwise denies any injuries.  Female.  The history is provided by the patient. No language interpreter was used.       Prior to Admission medications   Medication Sig Start Date End Date Taking? Authorizing Provider  acetaminophen  (TYLENOL ) 500 MG tablet Take 2,000 mg by mouth daily as needed for moderate pain.     [provider]  benazepril -hydrochlorthiazide (LOTENSIN  HCT) 10-12.5 MG per tablet Take 1 tablet by mouth every morning.    [provider]  docusate sodium  (COLACE) 100 MG capsule Take 1 capsule (100 mg total) by mouth 2 (two) times daily as needed for mild constipation. 02/05/22   Duwayne Purchase, MD  Dulaglutide (TRULICITY) 0.75 MG/0.5ML SOPN Inject 0.75 mg into the skin every Sunday.    [provider]  gabapentin  (NEURONTIN ) 100 MG capsule Take 1 capsule (100 mg total) by mouth 3 (three) times daily. 02/07/22   Bissell, Jaclyn M, PA-C  HYDROmorphone  (DILAUDID ) 2 MG tablet Take 1-2 tablets (2-4 mg total) by mouth every 3 (three) hours as needed for severe pain. 02/07/22   Bissell, Jaclyn M, PA-C  latanoprost  (XALATAN ) 0.005 % ophthalmic solution Place 1 drop into both eyes at bedtime. 01/22/17   [provider]  pantoprazole  (PROTONIX ) 40 MG tablet Take 40 mg by mouth every evening.    [provider]  polyethylene glycol (MIRALAX  / GLYCOLAX ) 17 g packet Take 17 g by mouth daily. 02/05/22   Duwayne Purchase, MD    Allergies: Patient has no known allergies.    Review of Systems  Constitutional:  Negative for chills and fever.  Respiratory:  Negative for shortness of breath.   Cardiovascular:  Positive for chest pain. Negative for palpitations.  Neurological:  Positive for syncope. Negative for numbness.  All other systems reviewed and are negative.   Updated Vital Signs BP 125/66   Pulse 65   Temp 98.6 F (37 C) (Oral)   Resp 13   SpO2 100%   Physical Exam Vitals and nursing note reviewed.  Constitutional:      General: She is not in acute distress.    Appearance: Normal appearance. She is not ill-appearing.  HENT:     Head: Normocephalic and atraumatic.     Nose: Nose normal.  Eyes:     Conjunctiva/sclera: Conjunctivae normal.  Cardiovascular:     Rate and Rhythm: Normal rate and regular rhythm.  Pulmonary:     Effort: Pulmonary effort is normal. No respiratory distress.  Musculoskeletal:        General: No deformity. Normal range of motion.     Comments: Left sided chest wall tenderness to palpation.  C-spine without tenderness to palpation.  Skin:    Findings: No rash.  Neurological:     Mental Status: She is alert.     (  all labs ordered are listed, but only abnormal results are displayed) Labs Reviewed  BASIC METABOLIC PANEL WITH GFR - Abnormal; Notable for the following components:      Result Value   Glucose, Bld 133 (*)    All other components within normal limits  CBG MONITORING, ED - Abnormal; Notable for the following components:   Glucose-Capillary 120 (*)    All other components within normal limits  CBC WITH DIFFERENTIAL/PLATELET  TROPONIN T, HIGH SENSITIVITY    EKG: EKG Interpretation Date/Time:  Wednesday December 02 2023 07:13:12 EDT Ventricular Rate:  69 PR Interval:  145 QRS Duration:  137 QT Interval:  405 QTC  Calculation: 434 R Axis:   86  Text Interpretation: Sinus rhythm Right bundle branch block Baseline wander in lead(s) III no sig change from previous Confirmed by Armenta Canning [phone]) on 12/02/2023 7:19:43 AM  Radiology: CT Angio Chest PE W and/or Wo Contrast Result Date: 12/02/2023 CLINICAL DATA:  Pulmonary embolism suspected, syncope, left-sided chest and rib pain EXAM: CT ANGIOGRAPHY CHEST WITH CONTRAST TECHNIQUE: Multidetector CT imaging of the chest was performed using the standard protocol during bolus administration of intravenous contrast. Multiplanar CT image reconstructions and MIPs were obtained to evaluate the vascular anatomy. RADIATION DOSE REDUCTION: This exam was performed according to the departmental dose-optimization program which includes automated exposure control, adjustment of the mA and/or kV according to patient size and/or use of iterative reconstruction technique. CONTRAST:  75mL OMNIPAQUE  IOHEXOL  350 MG/ML SOLN COMPARISON:  Same day rib radiographs FINDINGS: Cardiovascular: Satisfactory opacification of the pulmonary arteries to the segmental level. No evidence of pulmonary embolism. No pericardial effusion. Mediastinum/Nodes: No lymphadenopathy. Lungs/Pleura: Mosaic attenuation of the lung bases may be related to of subsegmental atelectasis with air trapping. No focal consolidations. Upper Abdomen: Cholelithiasis. Musculoskeletal: Nondisplaced left anterior seventh rib fracture. There is also likely nondisplaced left anterior 6 and 8th rib fractures. Review of the MIP images confirms the above findings. IMPRESSION: 1.  No acute pulmonary embolism identified. 2.  Likely nondisplaced left anterior 6-8 rib fractures. 3.  Nonspecific air trapping in the bilateral lung bases. Electronically Signed   By: Michaeline Blanch M.D.   On: 12/02/2023 09:52   CT Head Wo Contrast Result Date: 12/02/2023 CLINICAL DATA:  Provided history: Polytrauma, blunt. EXAM: CT HEAD WITHOUT CONTRAST CT  CERVICAL SPINE WITHOUT CONTRAST TECHNIQUE: Multidetector CT imaging of the head and cervical spine was performed following the standard protocol without intravenous contrast. Multiplanar CT image reconstructions of the cervical spine were also generated. RADIATION DOSE REDUCTION: This exam was performed according to the departmental dose-optimization program which includes automated exposure control, adjustment of the mA and/or kV according to patient size and/or use of iterative reconstruction technique. COMPARISON:  Head CT 05/20/2018. FINDINGS: CT HEAD FINDINGS Brain: Patchy and ill-defined hypoattenuation within the cerebral white matter, nonspecific but compatible with mild chronic small vessel ischemic disease. There is no acute intracranial hemorrhage. No demarcated cortical infarct. No extra-axial fluid collection. No evidence of an intracranial mass. No midline shift. Vascular: No hyperdense vessel.  Atherosclerotic calcifications. Skull: No calvarial fracture or aggressive osseous lesion. Sinuses/Orbits: No mass or acute finding within the imaged orbits. Mild mucosal thickening or small mucous retention cyst within the left sphenoid sinus. CT CERVICAL SPINE FINDINGS Alignment: Nonspecific straightening of the expected cervical lordosis. 2 mm grade 1 anterolisthesis at C3-C4 and C7-T1. Skull base and vertebrae: The basion-dental and atlanto-dental intervals are maintained.No evidence of acute fracture to the cervical spine. Soft tissues and spinal  canal: No prevertebral fluid or swelling. No visible canal hematoma. Disc levels: Cervical spondylosis with multilevel disc space narrowing, disc bulges/central disc protrusions, posterior disc osteophyte complexes, endplate spurring and uncovertebral hypertrophy. Disc space narrowing is greatest at C5-C6 and C6-C7 (moderate-to-advanced at these levels). Bilateral facet arthropathy at C7-T1. No appreciable high-grade spinal canal stenosis. Multilevel bony neural  foraminal narrowing. Degenerative changes also present at the C1-C2 articulation. Upper chest: No consolidation within the imaged lung apices. No visible pneumothorax. IMPRESSION: CT head: 1. No evidence of an acute intracranial abnormality. 2. Mild chronic small vessel ischemic changes within the cerebral white matter. CT cervical spine: 1. No evidence of acute cervical spine fracture. 2. Nonspecific straightening of the expected cervical lordosis. 3. 2 mm grade 1 anterolisthesis at C3-C4 and C7-T1. 4. Cervical spondylosis as described. Electronically Signed   By: Rockey Childs D.O.   On: 12/02/2023 09:33   CT Cervical Spine Wo Contrast Result Date: 12/02/2023 CLINICAL DATA:  Provided history: Polytrauma, blunt. EXAM: CT HEAD WITHOUT CONTRAST CT CERVICAL SPINE WITHOUT CONTRAST TECHNIQUE: Multidetector CT imaging of the head and cervical spine was performed following the standard protocol without intravenous contrast. Multiplanar CT image reconstructions of the cervical spine were also generated. RADIATION DOSE REDUCTION: This exam was performed according to the departmental dose-optimization program which includes automated exposure control, adjustment of the mA and/or kV according to patient size and/or use of iterative reconstruction technique. COMPARISON:  Head CT 05/20/2018. FINDINGS: CT HEAD FINDINGS Brain: Patchy and ill-defined hypoattenuation within the cerebral white matter, nonspecific but compatible with mild chronic small vessel ischemic disease. There is no acute intracranial hemorrhage. No demarcated cortical infarct. No extra-axial fluid collection. No evidence of an intracranial mass. No midline shift. Vascular: No hyperdense vessel.  Atherosclerotic calcifications. Skull: No calvarial fracture or aggressive osseous lesion. Sinuses/Orbits: No mass or acute finding within the imaged orbits. Mild mucosal thickening or small mucous retention cyst within the left sphenoid sinus. CT CERVICAL SPINE  FINDINGS Alignment: Nonspecific straightening of the expected cervical lordosis. 2 mm grade 1 anterolisthesis at C3-C4 and C7-T1. Skull base and vertebrae: The basion-dental and atlanto-dental intervals are maintained.No evidence of acute fracture to the cervical spine. Soft tissues and spinal canal: No prevertebral fluid or swelling. No visible canal hematoma. Disc levels: Cervical spondylosis with multilevel disc space narrowing, disc bulges/central disc protrusions, posterior disc osteophyte complexes, endplate spurring and uncovertebral hypertrophy. Disc space narrowing is greatest at C5-C6 and C6-C7 (moderate-to-advanced at these levels). Bilateral facet arthropathy at C7-T1. No appreciable high-grade spinal canal stenosis. Multilevel bony neural foraminal narrowing. Degenerative changes also present at the C1-C2 articulation. Upper chest: No consolidation within the imaged lung apices. No visible pneumothorax. IMPRESSION: CT head: 1. No evidence of an acute intracranial abnormality. 2. Mild chronic small vessel ischemic changes within the cerebral white matter. CT cervical spine: 1. No evidence of acute cervical spine fracture. 2. Nonspecific straightening of the expected cervical lordosis. 3. 2 mm grade 1 anterolisthesis at C3-C4 and C7-T1. 4. Cervical spondylosis as described. Electronically Signed   By: Rockey Childs D.O.   On: 12/02/2023 09:33   DG Ribs Unilateral W/Chest Left Result Date: 12/02/2023 CLINICAL DATA:  Fall this morning from standing height onto left side. Possible left rib fracture. EXAM: LEFT RIBS AND CHEST - 3+ VIEW COMPARISON:  Chest x-ray 07/10/2014 FINDINGS: Lungs are adequately inflated and otherwise clear. Cardiomediastinal silhouette is normal. Possible subtle fracture of the anterior left tenth rib possible subtle anterolateral left sixth rib fracture. IMPRESSION: 1.  No acute cardiopulmonary disease. 2. Possible subtle fractures of the anterior left tenth rib and anterolateral  left sixth rib. Electronically Signed   By: Toribio Agreste M.D.   On: 12/02/2023 08:08     Procedures   Medications Ordered in the ED  fentaNYL  (SUBLIMAZE ) injection 50 mcg (50 mcg Intravenous Given 12/02/23 0828)  sodium chloride  0.9 % bolus 1,000 mL (0 mLs Intravenous Stopped 12/02/23 1053)  iohexol  (OMNIPAQUE ) 350 MG/ML injection 75 mL (75 mLs Intravenous Contrast Given 12/02/23 0852)  morphine  (PF) 4 MG/ML injection 4 mg (4 mg Intravenous Given 12/02/23 0936)  fentaNYL  (SUBLIMAZE ) injection 50 mcg (50 mcg Intravenous Given 12/02/23 1237)    Clinical Course as of 12/02/23 1359  Wed Dec 02, 2023  1226 Patient still with some ongoing pain.  CT scan shows no PE but does show nondisplaced rib fractures on the left of ribs 6, 7, and 8.  Remainder of the CT scans including CT head, C-spine without acute concern. No leukocytosis or anemia.  BMP without acute concerns.  Troponin negative.  Telemetry without any concerning rhythms.  No concerning cause of syncope identified. [AA]    Clinical Course User Index [AA] Hildegard Loge, PA-C                                 Medical Decision Making Amount and/or Complexity of Data Reviewed Labs: ordered. Radiology: ordered.  Risk Prescription drug management.   Medical Decision Making / ED Course   This patient presents to the ED for concern of syncope, chest wall pain, this involves an extensive number of treatment options, and is a complaint that carries with it a high risk of complications and morbidity.  The differential diagnosis includes syncope, arrhythmia, rib fracture, contusion, vasovagal symptoms, orthostasis  MDM: 68 year old female presents today for concern of syncopal episode that occurred as she was walking to the bathroom this morning.  Chest wall pain reproducible on palpation. Chest x-ray with concern for single nondisplaced fracture. Given syncopal episode with no prodrome will further evaluate with blood work, EKG, troponin, and  CT angio chest PE study to rule out PE as well as further evaluate the fractures.  No PE on CT.  3 nondisplaced rib fractures on left side including his ribs 6, 7, and 8.  Maintained on telemetry.  No concerning arrhythmia noted.  Pain controlled after 3 rounds of medicine.  Incentive spirometer given to patient.  She was able to get up to 1500.  Discussed importance of this.  Given concern for concerning cause of syncope discussed with hospitalist for admission.  They accept admission and will evaluate patient.  Patient is in agreement with plan.   Additional history obtained: -Additional history obtained from husband at bedside -External records from outside source obtained and reviewed including: Chart review including previous notes, labs, imaging, consultation notes   Lab Tests: -I ordered, reviewed, and interpreted labs.   The pertinent results include:   Labs Reviewed  BASIC METABOLIC PANEL WITH GFR - Abnormal; Notable for the following components:      Result Value   Glucose, Bld 133 (*)    All other components within normal limits  CBG MONITORING, ED - Abnormal; Notable for the following components:   Glucose-Capillary 120 (*)    All other components within normal limits  CBC WITH DIFFERENTIAL/PLATELET  TROPONIN T, HIGH SENSITIVITY      EKG  EKG Interpretation Date/Time:  Wednesday  December 02 2023 07:13:12 EDT Ventricular Rate:  69 PR Interval:  145 QRS Duration:  137 QT Interval:  405 QTC Calculation: 434 R Axis:   86  Text Interpretation: Sinus rhythm Right bundle branch block Baseline wander in lead(s) III no sig change from previous Confirmed by Armenta Canning [phone]) on 12/02/2023 7:19:43 AM         Imaging Studies ordered: I ordered imaging studies including CT head, CT C-spine without acute findings.  CT angio chest PE study with nondisplaced 3 rib fractures, but no other acute finding.  Chest x-ray I independently visualized and interpreted  imaging. I agree with the radiologist interpretation   Medicines ordered and prescription drug management: Meds ordered this encounter  Medications   fentaNYL  (SUBLIMAZE ) injection 50 mcg   sodium chloride  0.9 % bolus 1,000 mL   iohexol  (OMNIPAQUE ) 350 MG/ML injection 75 mL   morphine  (PF) 4 MG/ML injection 4 mg   fentaNYL  (SUBLIMAZE ) injection 50 mcg    -I have reviewed the patients home medicines and have made adjustments as needed  Reevaluation: After the interventions noted above, I reevaluated the patient and found that they have :improved  Co morbidities that complicate the patient evaluation  Past Medical History:  Diagnosis Date   Arthritis    arhtritis back and hands   Diabetes mellitus without complication (HCC)    GERD (gastroesophageal reflux disease)    Hypertension    Leg cramps, sleep related    bi lat severe      Dispostion: Discussed with hospitalist.  They will evaluate patient for admission.   Final diagnoses:  Syncope, unspecified syncope type  Closed fracture of multiple ribs of left side, initial encounter    ED Discharge Orders     None          Hildegard Loge, PA-C 12/02/23 1411    Armenta Canning, MD 12/03/23 1359

## 2023-12-02 NOTE — Plan of Care (Signed)
   Problem: Fluid Volume: Goal: Ability to maintain a balanced intake and output will improve Outcome: Progressing   Problem: Health Behavior/Discharge Planning: Goal: Ability to manage health-related needs will improve Outcome: Progressing   Problem: Metabolic: Goal: Ability to maintain appropriate glucose levels will improve Outcome: Progressing

## 2023-12-03 ENCOUNTER — Other Ambulatory Visit: Payer: Self-pay | Admitting: Cardiology

## 2023-12-03 ENCOUNTER — Observation Stay (HOSPITAL_COMMUNITY)

## 2023-12-03 ENCOUNTER — Observation Stay

## 2023-12-03 DIAGNOSIS — R55 Syncope and collapse: Secondary | ICD-10-CM

## 2023-12-03 DIAGNOSIS — S2242XA Multiple fractures of ribs, left side, initial encounter for closed fracture: Secondary | ICD-10-CM | POA: Diagnosis not present

## 2023-12-03 LAB — COMPREHENSIVE METABOLIC PANEL WITH GFR
ALT: 9 U/L (ref 0–44)
AST: 17 U/L (ref 15–41)
Albumin: 3.9 g/dL (ref 3.5–5.0)
Alkaline Phosphatase: 64 U/L (ref 38–126)
Anion gap: 11 (ref 5–15)
BUN: 25 mg/dL — ABNORMAL HIGH (ref 8–23)
CO2: 23 mmol/L (ref 22–32)
Calcium: 9.4 mg/dL (ref 8.9–10.3)
Chloride: 106 mmol/L (ref 98–111)
Creatinine, Ser: 0.82 mg/dL (ref 0.44–1.00)
GFR, Estimated: >60 mL/min (ref >60)
Glucose, Bld: 105 mg/dL — ABNORMAL HIGH (ref 70–99)
Potassium: 4.4 mmol/L (ref 3.5–5.1)
Sodium: 140 mmol/L (ref 135–145)
Total Bilirubin: 0.6 mg/dL (ref 0.0–1.2)
Total Protein: 5.8 g/dL — ABNORMAL LOW (ref 6.5–8.1)

## 2023-12-03 LAB — ECHOCARDIOGRAM COMPLETE
Area-P 1/2: 4.45 cm2
Calc EF: 59.2 %
S' Lateral: 3.1 cm
Single Plane A2C EF: 58.4 %
Single Plane A4C EF: 58 %
Weight: 1858.92 [oz_av]

## 2023-12-03 LAB — GLUCOSE, CAPILLARY
Glucose-Capillary: 114 mg/dL — ABNORMAL HIGH (ref 70–99)
Glucose-Capillary: 141 mg/dL — ABNORMAL HIGH (ref 70–99)

## 2023-12-03 LAB — CBC
HCT: 34 % — ABNORMAL LOW (ref 36.0–46.0)
Hemoglobin: 11.1 g/dL — ABNORMAL LOW (ref 12.0–15.0)
MCH: 32.4 pg (ref 26.0–34.0)
MCHC: 32.6 g/dL (ref 30.0–36.0)
MCV: 99.1 fL (ref 80.0–100.0)
Platelets: 190 K/uL (ref 150–400)
RBC: 3.43 MIL/uL — ABNORMAL LOW (ref 3.87–5.11)
RDW: 13.2 % (ref 11.5–15.5)
WBC: 5.4 K/uL (ref 4.0–10.5)
nRBC: 0 % (ref 0.0–0.2)

## 2023-12-03 LAB — HEMOGLOBIN A1C
Hgb A1c MFr Bld: 5.6 % (ref 4.8–5.6)
Mean Plasma Glucose: 114.02 mg/dL

## 2023-12-03 LAB — HIV ANTIBODY (ROUTINE TESTING W REFLEX): HIV Screen 4th Generation wRfx: NONREACTIVE

## 2023-12-03 MED ORDER — METHOCARBAMOL 500 MG PO TABS: 500.0000 mg | ORAL_TABLET | Freq: Every day | ORAL | 0 refills | Status: DC | PRN

## 2023-12-03 MED ORDER — TRAMADOL HCL 50 MG PO TABS: 25.0000 mg | ORAL_TABLET | Freq: Four times a day (QID) | ORAL | 0 refills | Status: DC | PRN

## 2023-12-03 NOTE — Evaluation (Signed)
 Occupational Therapy Evaluation Patient Details Name: [name] MRN: [id number] DOB: August 08, 1955 Today's Date: 12/03/2023   History of Present Illness   [name] is a 68 yr old female brought to the hospital after she went down to the floor at home while ambulating to the bathroom at night. She was found to have possible subtle left rib fractures at 6 & 10. PMH: HTN, gallstones, CBD dilation, DM II, glaucoma, EUS on 11-25-23 due to common bile duct dilation, leg cramps, arhritis     Clinical Impressions The pt completed all assessed tasks independently, including lower body dressing, sit to stand, ambulating without an assistive device, toileting at bathroom level, and hand washing standing at the sink. Her blood pressure was taken and noted to be as follows: 148/79 in supine, seated EOB 147/76, standing 150/68, and standing after 1 minute 146/78. She reported having 3/10 L rib pain and 8/10 chronic back pain, however her pain did not significantly impair her functional abilities. She does not require further OT services. OT will sign off and recommend she return home at discharge. No post-acute therapy services are warranted      If plan is discharge home, recommend the following:   Other (comment) (N/A)     Functional Status Assessment   Patient has not had a recent decline in their functional status     Equipment Recommendations   None recommended by OT     Recommendations for Other Services         Precautions/Restrictions   Restrictions Weight Bearing Restrictions Per Provider Order: No     Mobility Bed Mobility Overal bed mobility: Independent Bed Mobility: Supine to Sit, Sit to Supine     Supine to sit: Independent Sit to supine: Independent        Transfers Overall transfer level: Independent Equipment used: None Transfers: Sit to/from Stand Sit to Stand: Independent                  Balance Overall balance assessment:  Independent         ADL either performed or assessed with clinical judgement   ADL Overall ADL's : Independent;At baseline            Vision   Additional Comments: She correctly read the time depicted on the wall clock.            Pertinent Vitals/Pain Pain Assessment Pain Assessment: 0-10 Pain Score: 8  Pain Location: 3/10 L rib pain. 8/10 chronic back pain Pain Descriptors / Indicators: Aching, Sore Pain Intervention(s): Limited activity within patient's tolerance, Monitored during session, Patient requesting pain meds-RN notified, Repositioned     Extremity/Trunk Assessment Upper Extremity Assessment Upper Extremity Assessment: Right hand dominant;RUE deficits/detail;LUE deficits/detail RUE Deficits / Details: AROM and strength WFL LUE Deficits / Details: AROM and strength WFL   Lower Extremity Assessment Lower Extremity Assessment: Overall WFL for tasks assessed;LLE deficits/detail;RLE deficits/detail RLE Deficits / Details: AROM and strength WFL LLE Deficits / Details: AROM and strength WFL       Communication Communication Communication: No apparent difficulties   Cognition Arousal: Alert Behavior During Therapy: WFL for tasks assessed/performed Cognition: No apparent impairments             OT - Cognition Comments: Oriented x4                 Following commands: Intact       Cueing  General Comments   Cueing Techniques: Verbal cues  Home Living Family/patient expects to be discharged to:: Private residence Living Arrangements: Other (Comment) (Friend) Available Help at Discharge: Friend(s) Type of Home: House Home Access: Ramped entrance     Home Layout: One level     Bathroom Shower/Tub: Tub/shower unit         Home Equipment: BSC/3in1;Rollator (4 wheels);Cane - single point;Wheelchair - Forensic psychologist (2 wheels)          Prior Functioning/Environment Prior Level of Function :  Independent/Modified Independent;Driving        Mobility Comments: She was independent with ambulation. ADLs Comments: She was independent with ADLs, cooking, cleaning, driving, and working part-time at [employer].    OT Problem List: Pain   OT Treatment/Interventions: Other (comment) (N/A)      OT Goals(Current goals can be found in the care plan section)   Acute Rehab OT Goals OT Goal Formulation: All assessment and education complete, DC therapy   OT Frequency:   (N/A)       AM-PAC OT 6 Clicks Daily Activity     Outcome Measure Help from another person eating meals?: None Help from another person taking care of personal grooming?: None Help from another person toileting, which includes using toliet, bedpan, or urinal?: None Help from another person bathing (including washing, rinsing, drying)?: None Help from another person to put on and taking off regular upper body clothing?: None Help from another person to put on and taking off regular lower body clothing?: None 6 Click Score: 24   End of Session Equipment Utilized During Treatment: Gait belt Nurse Communication: Other (comment) (negative orthostatics and mobility status)  Activity Tolerance: Patient tolerated treatment well Patient left: in bed;with call bell/phone within reach  OT Visit Diagnosis: Pain Pain - Right/Left: Left Pain - part of body:  (ribs and back)                Time: 8882-8860 OT Time Calculation (min): 22 min Charges:  OT General Charges $OT Visit: 1 Visit OT Evaluation $OT Eval Low Complexity: 1 Low    [name], OTR/L 12/03/2023, 3:51 PM

## 2023-12-03 NOTE — TOC Initial Note (Signed)
 Transition of Care [facility]) - Initial/Assessment Note    Patient Details  Name: [name] MRN: [id number] Date of Birth: 24-Jul-1955  Transition of Care [facility]) CM/SW Contact:    Sonda Manuella Quill, RN Phone Number: 12/03/2023, 12:15 PM  Clinical Narrative:                 [place] w/ pt in room; pt said she shares a home w/ her friend [name] [phone]); she plans to return at d/c and he will provide transportation; pt verified insurance/PCP; she denied SDOH risks; pt has cane, walker, wheelchair, BSC, and shower chair; pt said she does not have HH services or home oxygen; awaiting PT/OT evals; TOC is following.  Expected Discharge Plan: Home/Self Care Barriers to Discharge: Inadequate or no insurance   Patient Goals and CMS Choice Patient states their goals for this hospitalization and ongoing recovery are:: home CMS Medicare.gov Compare Post Acute Care list provided to:: Patient        Expected Discharge Plan and Services   Discharge Planning Services: CM Consult   Living arrangements for the past 2 months: Single Family Home                 DME Arranged: N/A DME Agency: NA       HH Arranged: NA HH Agency: NA        Prior Living Arrangements/Services Living arrangements for the past 2 months: Single Family Home Lives with:: Friends Patient language and need for interpreter reviewed:: Yes Do you feel safe going back to the place where you live?: Yes      Need for Family Participation in Patient Care: Yes (Comment) Care giver support system in place?: Yes (comment) Current home services: DME (cane, walker, wheelchair, BSC, shower chair) Criminal Activity/Legal Involvement Pertinent to Current Situation/Hospitalization: No - Comment as needed  Activities of Daily Living   ADL Screening (condition at time of admission) Independently performs ADLs?: Yes (appropriate for developmental age) Is the patient deaf or have difficulty hearing?: No Does the  patient have difficulty seeing, even when wearing glasses/contacts?: No Does the patient have difficulty concentrating, remembering, or making decisions?: No  Permission Sought/Granted Permission sought to share information with : Case Manager Permission granted to share information with : Yes, Verbal Permission Granted  Share Information with NAME: Case Manager     Permission granted to share info w Relationship: [name] (friend) [phone]     Emotional Assessment Appearance:: Appears stated age Attitude/Demeanor/Rapport: Gracious Affect (typically observed): Accepting Orientation: : Oriented to Self, Oriented to Place, Oriented to  Time, Oriented to Situation Alcohol / Substance Use: Not Applicable Psych Involvement: No (comment)  Admission diagnosis:  Syncope [R55] Closed fracture of multiple ribs of left side, initial encounter [S22.42XA] Syncope, unspecified syncope type [R55] Patient Active Problem List   Diagnosis Date Noted   Syncope, vasovagal 12/02/2023   Essential hypertension 12/02/2023   Diabetes mellitus type 2 in nonobese (HCC) 12/02/2023   Closed rib fracture 12/02/2023   Syncope 12/02/2023   Left knee DJD 02/05/2022   Spinal stenosis, lumbar 03/18/2017   Spinal stenosis of lumbar region 03/01/2014   Spinal stenosis at L4-L5 level 03/01/2014   PCP:  Stamey, Chiquita POUR, FNP Pharmacy:   [facility] [phone] - [place], [place] - [address] [place] [address] [place] [address] [place] [address] Phone: [phone] Fax: [phone]     Social Drivers of Health (SDOH) Social History: SDOH Screenings  Food Insecurity: No Food Insecurity (12/03/2023)  Housing: Low Risk  (12/03/2023)  Transportation Needs: No Transportation Needs (12/03/2023)  Utilities: Not At Risk (12/03/2023)  Social Connections: Unknown (12/02/2023)  Tobacco Use: Low Risk  (12/02/2023)   SDOH Interventions: Food Insecurity Interventions: Intervention Not Indicated,  Inpatient TOC Housing Interventions: Intervention Not Indicated, Inpatient TOC Transportation Interventions: Intervention Not Indicated, Inpatient TOC Utilities Interventions: Intervention Not Indicated, Inpatient TOC   Readmission Risk Interventions     No data to display

## 2023-12-03 NOTE — Care Management Obs Status (Signed)
 MEDICARE OBSERVATION STATUS NOTIFICATION   Patient Details  Name: OLIVA MONTECALVO MRN: [id number] Date of Birth: April 14, 1955   Medicare Observation Status Notification Given:  Yes    Sonda Manuella Quill, RN 12/03/2023, 11:57 AM

## 2023-12-03 NOTE — Progress Notes (Signed)
 PT Cancellation Note  Patient Details Name: [name] MRN: [id number] DOB: Jan 30, 1956   Cancelled Treatment:    Reason Eval/Treat Not Completed: Other (comment). Pt politely declines PT evaluation. Pt reports ambulated hallway with OT earlier, no AD, has been going to bathroom independently without nursing assistance, reports feeling like she is at baseline and politely declines PT evaluation. Will sign off at this time.   Metta Ave PT, DPT 12/03/23, 12:57 PM

## 2023-12-03 NOTE — Progress Notes (Signed)
  Echocardiogram 2D Echocardiogram has been performed.  [name] 12/03/2023, 9:15 AM

## 2023-12-03 NOTE — TOC Transition Note (Signed)
 Transition of Care [facility]) - Discharge Note   Patient Details  Name: [name] MRN: [id number] Date of Birth: 1956-02-02  [facility] CM/SW Contact:  Sonda Manuella Quill, RN Phone Number: 12/03/2023, 4:12 PM   Clinical Narrative:    D/C orders received; no TOC needs.   Final next level of care: Home/Self Care Barriers to Discharge: No Barriers Identified   Patient Goals and CMS Choice Patient states their goals for this hospitalization and ongoing recovery are:: home CMS Medicare.gov Compare Post Acute Care list provided to:: Patient        Discharge Placement                       Discharge Plan and Services Additional resources added to the After Visit Summary for     Discharge Planning Services: CM Consult            DME Arranged: N/A DME Agency: NA       HH Arranged: NA HH Agency: NA        Social Drivers of Health (SDOH) Interventions SDOH Screenings   Food Insecurity: No Food Insecurity (12/03/2023)  Housing: Low Risk  (12/03/2023)  Transportation Needs: No Transportation Needs (12/03/2023)  Utilities: Not At Risk (12/03/2023)  Social Connections: Unknown (12/02/2023)  Tobacco Use: Low Risk  (12/02/2023)     Readmission Risk Interventions     No data to display

## 2023-12-03 NOTE — Plan of Care (Signed)
  Problem: Education: Goal: Knowledge of condition and prescribed therapy will improve Outcome: Adequate for Discharge   Problem: Cardiac: Goal: Will achieve and/or maintain adequate cardiac output Outcome: Adequate for Discharge   Problem: Physical Regulation: Goal: Complications related to the disease process, condition or treatment will be avoided or minimized Outcome: Adequate for Discharge   Problem: Education: Goal: Ability to describe self-care measures that may prevent or decrease complications (Diabetes Survival Skills Education) will improve Outcome: Adequate for Discharge Goal: Individualized Educational Video(s) Outcome: Adequate for Discharge   Problem: Coping: Goal: Ability to adjust to condition or change in health will improve Outcome: Adequate for Discharge   Problem: Fluid Volume: Goal: Ability to maintain a balanced intake and output will improve Outcome: Adequate for Discharge   Problem: Health Behavior/Discharge Planning: Goal: Ability to identify and utilize available resources and services will improve Outcome: Adequate for Discharge Goal: Ability to manage health-related needs will improve Outcome: Adequate for Discharge   Problem: Metabolic: Goal: Ability to maintain appropriate glucose levels will improve Outcome: Adequate for Discharge   Problem: Nutritional: Goal: Maintenance of adequate nutrition will improve Outcome: Adequate for Discharge Goal: Progress toward achieving an optimal weight will improve Outcome: Adequate for Discharge   Problem: Skin Integrity: Goal: Risk for impaired skin integrity will decrease Outcome: Adequate for Discharge   Problem: Tissue Perfusion: Goal: Adequacy of tissue perfusion will improve Outcome: Adequate for Discharge   Problem: Education: Goal: Knowledge of General Education information will improve Description: Including pain rating scale, medication(s)/side effects and non-pharmacologic comfort  measures Outcome: Adequate for Discharge   Problem: Health Behavior/Discharge Planning: Goal: Ability to manage health-related needs will improve Outcome: Adequate for Discharge   Problem: Clinical Measurements: Goal: Ability to maintain clinical measurements within normal limits will improve Outcome: Adequate for Discharge Goal: Will remain free from infection Outcome: Adequate for Discharge Goal: Diagnostic test results will improve Outcome: Adequate for Discharge Goal: Respiratory complications will improve Outcome: Adequate for Discharge Goal: Cardiovascular complication will be avoided Outcome: Adequate for Discharge   Problem: Activity: Goal: Risk for activity intolerance will decrease Outcome: Adequate for Discharge   Problem: Nutrition: Goal: Adequate nutrition will be maintained Outcome: Adequate for Discharge   Problem: Coping: Goal: Level of anxiety will decrease Outcome: Adequate for Discharge   Problem: Elimination: Goal: Will not experience complications related to bowel motility Outcome: Adequate for Discharge Goal: Will not experience complications related to urinary retention Outcome: Adequate for Discharge   Problem: Pain Managment: Goal: General experience of comfort will improve and/or be controlled Outcome: Adequate for Discharge   Problem: Safety: Goal: Ability to remain free from injury will improve Outcome: Adequate for Discharge   Problem: Skin Integrity: Goal: Risk for impaired skin integrity will decrease Outcome: Adequate for Discharge

## 2023-12-03 NOTE — Progress Notes (Unsigned)
 Enrolled for [employer] to mail a ZIO XT long term holter monitor to the patients address on file.   DOD Dr. Shlomo to read.

## 2023-12-03 NOTE — Discharge Summary (Signed)
 [facility]  Physician Discharge Summary   Patient ID: [name] MRN: [id number] DOB/AGE: 09-Jun-1955 68 y.o.  Admit date: 12/02/2023 Discharge date: 12/03/2023    PCP: Stamey, Chiquita POUR, FNP  DISCHARGE DIAGNOSES:    Syncope, vasovagal   Essential hypertension   Diabetes mellitus type 2 in nonobese (HCC)   Closed rib fracture   Syncope   RECOMMENDATIONS FOR OUTPATIENT FOLLOW UP: Patient instructed to continue with incentive spirometry Message sent to cardiology to arrange heart monitor Neurointerventional radiology to arrange outpatient follow-up for brain aneurysm   Home Health: None Equipment/Devices: None  CODE STATUS: Full code  DISCHARGE CONDITION: fair  Diet recommendation: As before  INITIAL HISTORY: 68 y.o. female with a past medical history of essential hypertension, gallstones, CBD dilatation status post recent EUS, diabetes mellitus type 2, history of glaucoma who underwent an EUS on 9/17 for common bile duct dilatation noted on MRCP in April.  Gallstones were incidentally seen on the EUS.  No other significant pathology was noted.  Patient returned back to her usual activities.  She mentions that she has a longstanding history of leg cramps particularly worse at nighttime.  Last night the leg cramps were significant.  She could not get comfortable in the bed.  She got up in the middle of the night to go to the bathroom and the next thing she remembers is being on the floor.  She called out to her significant other who helped her.  She was subsequently brought into the emergency department.  She denied any chest pain either prior to or since the episode of syncope.  No shortness of breath.  No weakness on any one side.  She has vision difficulties for which she is followed by ophthalmology.  No recent changes in her vision though.  No recent changes to her medications.   In the emergency department she was noted to be hemodynamically stable.  She  underwent blood work, EKG.  She underwent CT angiogram of the chest which was negative for PE.  Left-sided rib fractures were noted.    HOSPITAL COURSE:    #1. Syncope, most likely vasovagal: Cardiogram did not show any concerning findings.  LVEF is normal.  No significant valvular disease.  EKG shows RBBB which is old compared to previous EKG. no arrhythmias noted on telemetry.  Message sent to cardiology to arrange heart monitor.  PE has been ruled out.  Orthostatics were unremarkable.  #2.  Left-sided rib fractures: This was secondary to fall.  No evidence of pulmonary contusion.  Saturating normal on room air.  Continue with incentive spirometry.  Chest x-ray from this morning was unremarkable.  Pain is better as well.   #3. Essential hypertension:    #4.  Diabetes mellitus type 2: Apparently on Trulicity.  HbA1c 5.6   #5.  CBD dilatation/cholelithiasis: Recently underwent EUS on 11/25/2023.  No acute findings were noted.  Incidental gallstones were seen.  Currently asymptomatic.  LFTs were unremarkable   #6.  Longstanding leg cramps: No swelling noted bilateral lower extremities.  No erythema.  Can use muscle relaxants as needed.   #7. Headaches/brain aneurysm: Patient with longstanding h/o headaches which have been worsening. CT without acute findings. MRI/MRA was done which suggested a right PCOM aneurysm measuring 4 mm.  Message sent to neurointerventional radiology to evaluate this further in the outpatient setting.  Findings communicated to the patient.  Patient is stable.  Okay for discharge home today.   PERTINENT LABS:  The results of  significant diagnostics from this hospitalization (including imaging, microbiology, ancillary and laboratory) are listed below for reference.      Labs:   Basic Metabolic Panel: Recent Labs  Lab 12/02/23 0709 12/03/23 0538  NA 140 140  K 4.7 4.4  CL 104 106  CO2 24 23  GLUCOSE 133* 105*  BUN 22 25*  CREATININE 0.92 0.82  CALCIUM  9.9 9.4   Liver Function Tests: Recent Labs  Lab 12/02/23 1713 12/03/23 0538  AST 20 17  ALT 9 9  ALKPHOS 70 64  BILITOT 1.0 0.6  PROT 6.3* 5.8*  ALBUMIN 4.2 3.9    CBC: Recent Labs  Lab 12/02/23 0709 12/03/23 0538  WBC 10.0 5.4  NEUTROABS 7.3  --   HGB 12.2 11.1*  HCT 39.2 34.0*  MCV 99.2 99.1  PLT 255 190    CBG: Recent Labs  Lab 12/02/23 0714 12/02/23 1748 12/02/23 2147 12/03/23 0459 12/03/23 1140  GLUCAP 120* 122* 184* 114* 141*     IMAGING STUDIES ECHOCARDIOGRAM COMPLETE Result Date: 12/03/2023    ECHOCARDIOGRAM REPORT   Patient Name:   [name] [place] Date of Exam: 12/03/2023 Medical Rec #:  [id number]         Height:       63.0 in Accession #:    [id number]        Weight:       116.2 lb Date of Birth:  1955-09-12        BSA:          1.535 m Patient Age:    68 years          BP:           133/68 mmHg Patient Gender: F                 HR:           73 bpm. Exam Location:  Inpatient Procedure: 2D Echo, 3D Echo, Cardiac Doppler and Color Doppler (Both Spectral            and Color Flow Doppler were utilized during procedure). Indications:    R55 Syncope  History:        Patient has no prior history of Echocardiogram examinations.                 Signs/Symptoms:Syncope; Risk Factors:Hypertension and Diabetes.  Sonographer:    Ellouise Mose RDCS Referring Phys: [id number] [employer]  Sonographer Comments: Technically difficult study due to poor echo windows. Syngo delay. IMPRESSIONS  1. False tendon with mobile calcifiication present in LV apex. . Left ventricular ejection fraction, by estimation, is 60 to 65%. Left ventricular ejection fraction by 3D volume is 61 %. Left ventricular ejection fraction by 2D MOD biplane is 59.2 %. The left ventricle has normal function. The left ventricle has no regional wall motion abnormalities. The left ventricular internal cavity size was mildly dilated. Left ventricular diastolic parameters are consistent with Grade I diastolic dysfunction  (impaired relaxation).  2. Right ventricular systolic function is normal. The right ventricular size is normal. Tricuspid regurgitation signal is inadequate for assessing PA pressure.  3. The mitral valve is degenerative. Trivial mitral valve regurgitation. No evidence of mitral stenosis.  4. The aortic valve is tricuspid. There is mild calcification of the aortic valve. Aortic valve regurgitation is not visualized. Aortic valve sclerosis is present, with no evidence of aortic valve stenosis.  5. The inferior vena cava is normal in size with greater than 50% respiratory  variability, suggesting right atrial pressure of 3 mmHg. Comparison(s): No prior Echocardiogram. FINDINGS  Left Ventricle: False tendon with mobile calcifiication present in LV apex. Left ventricular ejection fraction, by estimation, is 60 to 65%. Left ventricular ejection fraction by 2D MOD biplane is 59.2 %. Left ventricular ejection fraction by 3D volume is 61 %. The left ventricle has normal function. The left ventricle has no regional wall motion abnormalities. The left ventricular internal cavity size was mildly dilated. There is no left ventricular hypertrophy. Left ventricular diastolic parameters are consistent with Grade I diastolic dysfunction (impaired relaxation). Right Ventricle: The right ventricular size is normal. No increase in right ventricular wall thickness. Right ventricular systolic function is normal. Tricuspid regurgitation signal is inadequate for assessing PA pressure. Left Atrium: Left atrial size was normal in size. Right Atrium: Right atrial size was normal in size. Pericardium: There is no evidence of pericardial effusion. Mitral Valve: The mitral valve is degenerative in appearance. Trivial mitral valve regurgitation. No evidence of mitral valve stenosis. Tricuspid Valve: The tricuspid valve is normal in structure. Tricuspid valve regurgitation is not demonstrated. No evidence of tricuspid stenosis. Aortic Valve: The  aortic valve is tricuspid. There is mild calcification of the aortic valve. Aortic valve regurgitation is not visualized. Aortic valve sclerosis is present, with no evidence of aortic valve stenosis. Pulmonic Valve: The pulmonic valve was normal in structure. Pulmonic valve regurgitation is not visualized. No evidence of pulmonic stenosis. Aorta: The aortic root and ascending aorta are structurally normal, with no evidence of dilitation. Venous: The inferior vena cava is normal in size with greater than 50% respiratory variability, suggesting right atrial pressure of 3 mmHg. IAS/Shunts: No atrial level shunt detected by color flow Doppler. Additional Comments: 3D was performed not requiring image post processing on an independent workstation and was normal.  LEFT VENTRICLE PLAX 2D                        Biplane EF (MOD) LVIDd:         4.20 cm         LV Biplane EF:   Left LVIDs:         3.10 cm                          ventricular LV PW:         0.90 cm                          ejection LV IVS:        0.70 cm                          fraction by LVOT diam:     2.20 cm                          2D MOD LV SV:         74                               biplane is LV SV Index:   48                               59.2 %. LVOT Area:  3.80 cm LV IVRT:       102 msec        Diastology                                LV e' medial:    8.16 cm/s                                LV E/e' medial:  12.0 LV Volumes (MOD)               LV e' lateral:   11.70 cm/s LV vol d, MOD    64.9 ml       LV E/e' lateral: 8.4 A2C: LV vol d, MOD    74.5 ml A4C:                           3D Volume EF LV vol s, MOD    27.0 ml       LV 3D EF:    Left A2C:                                        ventricul LV vol s, MOD    31.3 ml                    ar A4C:                                        ejection LV SV MOD A2C:   37.9 ml                    fraction LV SV MOD A4C:   74.5 ml                    by 3D LV SV MOD BP:    42.6 ml                     volume is                                             61 %.                                 3D Volume EF:                                3D EF:        61 %                                LV EDV:       119 ml                                LV ESV:  46 ml                                LV SV:        73 ml RIGHT VENTRICLE             IVC RV S prime:     11.60 cm/s  IVC diam: 0.80 cm TAPSE (M-mode): 2.5 cm LEFT ATRIUM             Index        RIGHT ATRIUM          Index LA diam:        2.20 cm 1.43 cm/m   RA Area:     6.89 cm LA Vol (A2C):   33.4 ml 21.76 ml/m  RA Volume:   12.60 ml 8.21 ml/m LA Vol (A4C):   24.7 ml 16.09 ml/m LA Biplane Vol: 30.6 ml 19.93 ml/m  AORTIC VALVE LVOT Vmax:   93.80 cm/s LVOT Vmean:  62.000 cm/s LVOT VTI:    0.195 m  AORTA Ao Asc diam: 2.70 cm MITRAL VALVE MV Area (PHT): 4.45 cm    SHUNTS MV Decel Time: 171 msec    Systemic VTI:  0.20 m MV E velocity: 98.25 cm/s  Systemic Diam: 2.20 cm MV A velocity: 98.15 cm/s MV E/A ratio:  1.00 Dalton McleanMD Electronically signed by Ezra Kanner Signature Date/Time: 12/03/2023/4:02:44 PM    Final    DG CHEST PORT 1 VIEW Result Date: 12/03/2023 EXAM: 1 VIEW(S) XRAY OF THE CHEST 12/03/2023 07:05:00 AM COMPARISON: 12/02/2023 CLINICAL HISTORY: Hx rib fracture FINDINGS: LUNGS AND PLEURA: No focal pulmonary opacity. No pulmonary edema. No pleural effusion. No pneumothorax. HEART AND MEDIASTINUM: No acute abnormality of the cardiac and mediastinal silhouettes. BONES AND SOFT TISSUES: Known left anterior rib fractures are not well appreciated by radiograph. IMPRESSION: 1. Normal chest radiograph. No acute cardiopulmonary abnormality identified. 2. Known left anterior rib fractures are not well visualized on this study. Electronically signed by: Waddell Calk MD 12/03/2023 07:49 AM EDT RP Workstation: [id number]   MR BRAIN WO CONTRAST Result Date: 12/02/2023 CLINICAL DATA:  Initial evaluation for acute visual disturbance, worsening headache.  EXAM: MRI HEAD WITHOUT CONTRAST MRA HEAD WITHOUT CONTRAST TECHNIQUE: Multiplanar, multi-echo pulse sequences of the brain and surrounding structures were acquired without intravenous contrast. Angiographic images of the Circle of Willis were acquired using MRA technique without intravenous contrast. COMPARISON:  CT from earlier the same day. FINDINGS: MRI HEAD FINDINGS Brain: Cerebral volume within normal limits. Patchy T2/FLAIR hyperintensity involving the periventricular and deep white matter both cerebral hemispheres as well as the pons, most like related chronic microvascular ischemic disease, moderate in nature. No abnormal foci of restricted diffusion to suggest acute or subacute ischemia. Gray-white matter differentiation maintained. No areas of chronic cortical infarction. No acute or chronic intracranial blood products. No mass lesion, midline shift or mass effect. No hydrocephalus or extra-axial fluid collection. Pituitary gland within normal limits. Vascular: Major intracranial vascular flow voids are maintained. Skull and upper cervical spine: Craniocervical junction within normal limits. Bone marrow signal intensity within normal limits. No scalp soft tissue abnormality. Sinuses/Orbits: Globes orbital soft tissues within normal limits. Mild scattered mucosal thickening present about the ethmoidal air cells. Paranasal sinuses are otherwise largely clear. Trace left mastoid effusion noted, of doubtful significance. Other: None. MRA HEAD FINDINGS Anterior circulation: Both internal carotid arteries are patent through the siphons without stenosis. 4 mm saccular  aneurysm seen extending posteriorly and inferiorly from the supraclinoid right ICA, consistent with a PCOM aneurysm (series 9, image 94). A1 segments patent bilaterally. Left A1 dominant. Normal anterior communicating artery complex. Anterior cerebral arteries patent without significant stenosis. No M1 stenosis or occlusion. No proximal MCA branch  occlusion. Distal MCA branches perfused and symmetric. Posterior circulation: Both V4 segments patent without significant stenosis. Right PICA patent as origin. Left PICA origin not well seen. Focal tortuosity with kinking of the mid basilar artery with superimposed mild-to-moderate short-segment stenosis (series 1039, image 11). Basilar patent distally. Superior cerebral arteries patent bilaterally. Predominant fetal type origin of the PCAs bilaterally. Both PCAs patent to their distal aspects without significant stenosis. Anatomic variants: As above. IMPRESSION: MRI HEAD: 1. No acute intracranial abnormality. 2. Moderate chronic microvascular ischemic disease. MRA HEAD: 1. Negative intracranial MRA for large vessel occlusion. 2. 4 mm right PCOM aneurysm. Finding could potentially contribute to visual symptoms and headaches. Neurointerventional consultation for evaluation of possible endovascular treatment recommended. 3. Focal tortuosity with kinking of the mid basilar artery with superimposed mild-to-moderate short-segment stenosis. Electronically Signed   By: Morene Hoard M.D.   On: 12/02/2023 19:55   MR ANGIO HEAD WO CONTRAST Result Date: 12/02/2023 CLINICAL DATA:  Initial evaluation for acute visual disturbance, worsening headache. EXAM: MRI HEAD WITHOUT CONTRAST MRA HEAD WITHOUT CONTRAST TECHNIQUE: Multiplanar, multi-echo pulse sequences of the brain and surrounding structures were acquired without intravenous contrast. Angiographic images of the Circle of Willis were acquired using MRA technique without intravenous contrast. COMPARISON:  CT from earlier the same day. FINDINGS: MRI HEAD FINDINGS Brain: Cerebral volume within normal limits. Patchy T2/FLAIR hyperintensity involving the periventricular and deep white matter both cerebral hemispheres as well as the pons, most like related chronic microvascular ischemic disease, moderate in nature. No abnormal foci of restricted diffusion to suggest  acute or subacute ischemia. Gray-white matter differentiation maintained. No areas of chronic cortical infarction. No acute or chronic intracranial blood products. No mass lesion, midline shift or mass effect. No hydrocephalus or extra-axial fluid collection. Pituitary gland within normal limits. Vascular: Major intracranial vascular flow voids are maintained. Skull and upper cervical spine: Craniocervical junction within normal limits. Bone marrow signal intensity within normal limits. No scalp soft tissue abnormality. Sinuses/Orbits: Globes orbital soft tissues within normal limits. Mild scattered mucosal thickening present about the ethmoidal air cells. Paranasal sinuses are otherwise largely clear. Trace left mastoid effusion noted, of doubtful significance. Other: None. MRA HEAD FINDINGS Anterior circulation: Both internal carotid arteries are patent through the siphons without stenosis. 4 mm saccular aneurysm seen extending posteriorly and inferiorly from the supraclinoid right ICA, consistent with a PCOM aneurysm (series 9, image 94). A1 segments patent bilaterally. Left A1 dominant. Normal anterior communicating artery complex. Anterior cerebral arteries patent without significant stenosis. No M1 stenosis or occlusion. No proximal MCA branch occlusion. Distal MCA branches perfused and symmetric. Posterior circulation: Both V4 segments patent without significant stenosis. Right PICA patent as origin. Left PICA origin not well seen. Focal tortuosity with kinking of the mid basilar artery with superimposed mild-to-moderate short-segment stenosis (series 1039, image 11). Basilar patent distally. Superior cerebral arteries patent bilaterally. Predominant fetal type origin of the PCAs bilaterally. Both PCAs patent to their distal aspects without significant stenosis. Anatomic variants: As above. IMPRESSION: MRI HEAD: 1. No acute intracranial abnormality. 2. Moderate chronic microvascular ischemic disease. MRA  HEAD: 1. Negative intracranial MRA for large vessel occlusion. 2. 4 mm right PCOM aneurysm. Finding could potentially contribute to visual  symptoms and headaches. Neurointerventional consultation for evaluation of possible endovascular treatment recommended. 3. Focal tortuosity with kinking of the mid basilar artery with superimposed mild-to-moderate short-segment stenosis. Electronically Signed   By: Morene Hoard M.D.   On: 12/02/2023 19:55   CT Angio Chest PE W and/or Wo Contrast Result Date: 12/02/2023 CLINICAL DATA:  Pulmonary embolism suspected, syncope, left-sided chest and rib pain EXAM: CT ANGIOGRAPHY CHEST WITH CONTRAST TECHNIQUE: Multidetector CT imaging of the chest was performed using the standard protocol during bolus administration of intravenous contrast. Multiplanar CT image reconstructions and MIPs were obtained to evaluate the vascular anatomy. RADIATION DOSE REDUCTION: This exam was performed according to the departmental dose-optimization program which includes automated exposure control, adjustment of the mA and/or kV according to patient size and/or use of iterative reconstruction technique. CONTRAST:  75mL OMNIPAQUE  IOHEXOL  350 MG/ML SOLN COMPARISON:  Same day rib radiographs FINDINGS: Cardiovascular: Satisfactory opacification of the pulmonary arteries to the segmental level. No evidence of pulmonary embolism. No pericardial effusion. Mediastinum/Nodes: No lymphadenopathy. Lungs/Pleura: Mosaic attenuation of the lung bases may be related to of subsegmental atelectasis with air trapping. No focal consolidations. Upper Abdomen: Cholelithiasis. Musculoskeletal: Nondisplaced left anterior seventh rib fracture. There is also likely nondisplaced left anterior 6 and 8th rib fractures. Review of the MIP images confirms the above findings. IMPRESSION: 1.  No acute pulmonary embolism identified. 2.  Likely nondisplaced left anterior 6-8 rib fractures. 3.  Nonspecific air trapping in the  bilateral lung bases. Electronically Signed   By: Michaeline Blanch M.D.   On: 12/02/2023 09:52   CT Head Wo Contrast Result Date: 12/02/2023 CLINICAL DATA:  Provided history: Polytrauma, blunt. EXAM: CT HEAD WITHOUT CONTRAST CT CERVICAL SPINE WITHOUT CONTRAST TECHNIQUE: Multidetector CT imaging of the head and cervical spine was performed following the standard protocol without intravenous contrast. Multiplanar CT image reconstructions of the cervical spine were also generated. RADIATION DOSE REDUCTION: This exam was performed according to the departmental dose-optimization program which includes automated exposure control, adjustment of the mA and/or kV according to patient size and/or use of iterative reconstruction technique. COMPARISON:  Head CT 05/20/2018. FINDINGS: CT HEAD FINDINGS Brain: Patchy and ill-defined hypoattenuation within the cerebral white matter, nonspecific but compatible with mild chronic small vessel ischemic disease. There is no acute intracranial hemorrhage. No demarcated cortical infarct. No extra-axial fluid collection. No evidence of an intracranial mass. No midline shift. Vascular: No hyperdense vessel.  Atherosclerotic calcifications. Skull: No calvarial fracture or aggressive osseous lesion. Sinuses/Orbits: No mass or acute finding within the imaged orbits. Mild mucosal thickening or small mucous retention cyst within the left sphenoid sinus. CT CERVICAL SPINE FINDINGS Alignment: Nonspecific straightening of the expected cervical lordosis. 2 mm grade 1 anterolisthesis at C3-C4 and C7-T1. Skull base and vertebrae: The basion-dental and atlanto-dental intervals are maintained.No evidence of acute fracture to the cervical spine. Soft tissues and spinal canal: No prevertebral fluid or swelling. No visible canal hematoma. Disc levels: Cervical spondylosis with multilevel disc space narrowing, disc bulges/central disc protrusions, posterior disc osteophyte complexes, endplate spurring and  uncovertebral hypertrophy. Disc space narrowing is greatest at C5-C6 and C6-C7 (moderate-to-advanced at these levels). Bilateral facet arthropathy at C7-T1. No appreciable high-grade spinal canal stenosis. Multilevel bony neural foraminal narrowing. Degenerative changes also present at the C1-C2 articulation. Upper chest: No consolidation within the imaged lung apices. No visible pneumothorax. IMPRESSION: CT head: 1. No evidence of an acute intracranial abnormality. 2. Mild chronic small vessel ischemic changes within the cerebral white matter. CT cervical spine:  1. No evidence of acute cervical spine fracture. 2. Nonspecific straightening of the expected cervical lordosis. 3. 2 mm grade 1 anterolisthesis at C3-C4 and C7-T1. 4. Cervical spondylosis as described. Electronically Signed   By: Rockey Childs D.O.   On: 12/02/2023 09:33   CT Cervical Spine Wo Contrast Result Date: 12/02/2023 CLINICAL DATA:  Provided history: Polytrauma, blunt. EXAM: CT HEAD WITHOUT CONTRAST CT CERVICAL SPINE WITHOUT CONTRAST TECHNIQUE: Multidetector CT imaging of the head and cervical spine was performed following the standard protocol without intravenous contrast. Multiplanar CT image reconstructions of the cervical spine were also generated. RADIATION DOSE REDUCTION: This exam was performed according to the departmental dose-optimization program which includes automated exposure control, adjustment of the mA and/or kV according to patient size and/or use of iterative reconstruction technique. COMPARISON:  Head CT 05/20/2018. FINDINGS: CT HEAD FINDINGS Brain: Patchy and ill-defined hypoattenuation within the cerebral white matter, nonspecific but compatible with mild chronic small vessel ischemic disease. There is no acute intracranial hemorrhage. No demarcated cortical infarct. No extra-axial fluid collection. No evidence of an intracranial mass. No midline shift. Vascular: No hyperdense vessel.  Atherosclerotic calcifications. Skull:  No calvarial fracture or aggressive osseous lesion. Sinuses/Orbits: No mass or acute finding within the imaged orbits. Mild mucosal thickening or small mucous retention cyst within the left sphenoid sinus. CT CERVICAL SPINE FINDINGS Alignment: Nonspecific straightening of the expected cervical lordosis. 2 mm grade 1 anterolisthesis at C3-C4 and C7-T1. Skull base and vertebrae: The basion-dental and atlanto-dental intervals are maintained.No evidence of acute fracture to the cervical spine. Soft tissues and spinal canal: No prevertebral fluid or swelling. No visible canal hematoma. Disc levels: Cervical spondylosis with multilevel disc space narrowing, disc bulges/central disc protrusions, posterior disc osteophyte complexes, endplate spurring and uncovertebral hypertrophy. Disc space narrowing is greatest at C5-C6 and C6-C7 (moderate-to-advanced at these levels). Bilateral facet arthropathy at C7-T1. No appreciable high-grade spinal canal stenosis. Multilevel bony neural foraminal narrowing. Degenerative changes also present at the C1-C2 articulation. Upper chest: No consolidation within the imaged lung apices. No visible pneumothorax. IMPRESSION: CT head: 1. No evidence of an acute intracranial abnormality. 2. Mild chronic small vessel ischemic changes within the cerebral white matter. CT cervical spine: 1. No evidence of acute cervical spine fracture. 2. Nonspecific straightening of the expected cervical lordosis. 3. 2 mm grade 1 anterolisthesis at C3-C4 and C7-T1. 4. Cervical spondylosis as described. Electronically Signed   By: Rockey Childs D.O.   On: 12/02/2023 09:33   DG Ribs Unilateral W/Chest Left Result Date: 12/02/2023 CLINICAL DATA:  Fall this morning from standing height onto left side. Possible left rib fracture. EXAM: LEFT RIBS AND CHEST - 3+ VIEW COMPARISON:  Chest x-ray 07/10/2014 FINDINGS: Lungs are adequately inflated and otherwise clear. Cardiomediastinal silhouette is normal. Possible subtle  fracture of the anterior left tenth rib possible subtle anterolateral left sixth rib fracture. IMPRESSION: 1. No acute cardiopulmonary disease. 2. Possible subtle fractures of the anterior left tenth rib and anterolateral left sixth rib. Electronically Signed   By: Toribio Agreste M.D.   On: 12/02/2023 08:08    DISCHARGE EXAMINATION: Vitals:   12/02/23 2145 12/03/23 0108 12/03/23 0502 12/03/23 1204  BP: (!) 93/56 110/68 133/68 138/70  Pulse: 76 70 64 77  Resp: 18 18 18 18   Temp: 98.6 F (37 C) 98.1 F (36.7 C) 98.3 F (36.8 C) 98.3 F (36.8 C)  TempSrc:      SpO2: 99% 99% 99% 99%  Weight:   52.7 kg  General appearance: Awake alert.  In no distress Resp: Clear to auscultation bilaterally.  Normal effort Cardio: S1-S2 is normal regular.  No S3-S4.  No rubs murmurs or bruit GI: Abdomen is soft.  Nontender nondistended.  Bowel sounds are present normal.  No masses organomegaly Extremities: No edema.  Full range of motion of lower extremities. Neurologic: Alert and oriented x3.  No focal neurological deficits.    DISPOSITION: Home  Discharge Instructions     Call MD for:  difficulty breathing, headache or visual disturbances   Complete by: As directed    Call MD for:  extreme fatigue   Complete by: As directed    Call MD for:  persistant dizziness or light-headedness   Complete by: As directed    Call MD for:  persistant nausea and vomiting   Complete by: As directed    Call MD for:  severe uncontrolled pain   Complete by: As directed    Call MD for:  temperature >100.4   Complete by: As directed    Diet - low sodium heart healthy   Complete by: As directed    Discharge instructions   Complete by: As directed    The neurointerventional radiologist office will contact you to schedule appointment regarding the aneurysm.  You will also hear from cardiology office regarding heart monitor.  Please seek attention if your symptoms recur.  You were cared for by a hospitalist during  your hospital stay. If you have any questions about your discharge medications or the care you received while you were in the hospital after you are discharged, you can call the unit and asked to speak with the hospitalist on call if the hospitalist that took care of you is not available. Once you are discharged, your primary care physician will handle any further medical issues. Please note that NO REFILLS for any discharge medications will be authorized once you are discharged, as it is imperative that you return to your primary care physician (or establish a relationship with a primary care physician if you do not have one) for your aftercare needs so that they can reassess your need for medications and monitor your lab values. If you do not have a primary care physician, you can call [phone] for a physician referral.   Increase activity slowly   Complete by: As directed          Allergies as of 12/03/2023       Reactions   Rosuvastatin Other (See Comments)   Leg cramps        Medication List     TAKE these medications    acetaminophen  500 MG tablet Commonly known as: TYLENOL  Take 2,000 mg by mouth in the morning, at noon, and at bedtime.   benazepril -hydrochlorthiazide 10-12.5 MG tablet Commonly known as: LOTENSIN  HCT Take 1 tablet by mouth every morning.   docusate sodium  100 MG capsule Commonly known as: Colace Take 1 capsule (100 mg total) by mouth 2 (two) times daily as needed for mild constipation.   Flonase Sensimist 27.5 MCG/SPRAY nasal spray Generic drug: fluticasone Place 2 sprays into the nose daily as needed for rhinitis or allergies.   gabapentin  600 MG tablet Commonly known as: NEURONTIN  Take 600 mg by mouth See admin instructions. Take 600 mg by mouth at bedtime as needed for pain or sleep and an additional 600 mg once a day as needed for pain   latanoprost  0.005 % ophthalmic solution Commonly known as: XALATAN  Place 1 drop into  both eyes at bedtime.    methocarbamol  500 MG tablet Commonly known as: ROBAXIN  Take 1 tablet (500 mg total) by mouth daily as needed for muscle spasms.   pantoprazole  40 MG tablet Commonly known as: PROTONIX  Take 40 mg by mouth daily before breakfast.   traMADol  50 MG tablet Commonly known as: ULTRAM  Take 0.5 tablets (25 mg total) by mouth every 6 (six) hours as needed for moderate pain (pain score 4-6).   Trulicity 0.75 MG/0.5ML Soaj Generic drug: Dulaglutide Inject 0.75 mg into the skin every Sunday.          Follow-up Information     Lester Golas, MD Follow up.   Specialties: Interventional Radiology, Neurosurgery, Diagnostic Radiology Why: His office will call you to set up an appointment to further discuss the aneurysm that was found on the MRI Contact information: [address] [place] [address] [phone]         Stamey, Chiquita POUR, FNP. Schedule an appointment as soon as possible for a visit in 1 week(s).   Specialty: Family Medicine Why: post hospitalization follow up Contact information: [address] [place] [address] [phone]         Vicci Rollo SAUNDERS, PA-C Follow up on 01/06/2024.   Specialty: Cardiology Why: Heart montor will be mailed to you. F/u on 01/06/24 to discuss results. Contact information: [address] [place] [address] [phone]                 TOTAL DISCHARGE TIME: 35 minutes  Spyridon Hornstein Verdene  [facility] Pager on [email or web address]  12/04/2023, 10:37 AM

## 2023-12-04 ENCOUNTER — Telehealth: Payer: Self-pay

## 2023-12-04 DIAGNOSIS — G44209 Tension-type headache, unspecified, not intractable: Secondary | ICD-10-CM | POA: Insufficient documentation

## 2023-12-04 DIAGNOSIS — E782 Mixed hyperlipidemia: Secondary | ICD-10-CM | POA: Insufficient documentation

## 2023-12-04 DIAGNOSIS — E1149 Type 2 diabetes mellitus with other diabetic neurological complication: Secondary | ICD-10-CM | POA: Insufficient documentation

## 2023-12-04 DIAGNOSIS — G44229 Chronic tension-type headache, not intractable: Secondary | ICD-10-CM | POA: Insufficient documentation

## 2023-12-04 DIAGNOSIS — H05249 Constant exophthalmos, unspecified eye: Secondary | ICD-10-CM | POA: Insufficient documentation

## 2023-12-04 DIAGNOSIS — E0590 Thyrotoxicosis, unspecified without thyrotoxic crisis or storm: Secondary | ICD-10-CM | POA: Insufficient documentation

## 2023-12-04 DIAGNOSIS — K219 Gastro-esophageal reflux disease without esophagitis: Secondary | ICD-10-CM | POA: Insufficient documentation

## 2023-12-04 DIAGNOSIS — Z124 Encounter for screening for malignant neoplasm of cervix: Secondary | ICD-10-CM | POA: Insufficient documentation

## 2023-12-04 DIAGNOSIS — G629 Polyneuropathy, unspecified: Secondary | ICD-10-CM | POA: Insufficient documentation

## 2023-12-04 DIAGNOSIS — E1136 Type 2 diabetes mellitus with diabetic cataract: Secondary | ICD-10-CM | POA: Insufficient documentation

## 2023-12-04 NOTE — Telephone Encounter (Signed)
 No Answer/Busy - No answer, left a vm asking pt to return our call to get her scheduled with Dr. Lester for next week

## 2023-12-08 DIAGNOSIS — I10 Essential (primary) hypertension: Secondary | ICD-10-CM | POA: Diagnosis not present

## 2023-12-08 DIAGNOSIS — E1136 Type 2 diabetes mellitus with diabetic cataract: Secondary | ICD-10-CM | POA: Diagnosis not present

## 2023-12-08 DIAGNOSIS — E782 Mixed hyperlipidemia: Secondary | ICD-10-CM | POA: Diagnosis not present

## 2023-12-10 ENCOUNTER — Ambulatory Visit: Admitting: Neuroradiology

## 2023-12-10 ENCOUNTER — Encounter: Payer: Self-pay | Admitting: Neuroradiology

## 2023-12-10 VITALS — BP 137/84 | HR 77 | Ht 63.25 in | Wt 113.4 lb

## 2023-12-10 DIAGNOSIS — I671 Cerebral aneurysm, nonruptured: Secondary | ICD-10-CM

## 2023-12-10 NOTE — Progress Notes (Signed)
   Progress Note: Referring Physician:  Crecencio Chiquita POUR, FNP [address] [place],  [place] [address]  Primary Physician:  Crecencio Chiquita POUR, FNP  Chief Complaint:   Brain aneurysm  History of Present Illness: [name] is a 68 y.o. female who presents with the chief complaint of an incidentally detected brain aneurysm.  She had an episode of syncope which prompted neuroimaging.  This included a head CT, brain MRI and MR angiogram.  I reviewed all of those studies personally.  The head CT and MR eye were negative.  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  It appears to have a narrow neck.  She has not had any headaches suggestive of subarachnoid hemorrhage.  She has a past medical history of well-controlled hypertension and diabetes.    Review of Systems:  Review of Systems  Respiratory:  Negative for shortness of breath.   Cardiovascular:  Negative for chest pain.  Gastrointestinal:  Negative for abdominal pain.    Exam: Today's Vitals   12/10/23 1259  BP: 137/84  Pulse: 77  SpO2: 98%  Weight: 113 lb 6.4 oz (51.4 kg)  Height: 5' 3.25 (1.607 m)  PainSc: 2   PainLoc: Head   Body mass index is 19.93 kg/m.   Physical Exam Constitutional:      Appearance: Normal appearance. She is normal weight.  Cardiovascular:     Rate and Rhythm: Normal rate and regular rhythm.     Pulses: Normal pulses.     Heart sounds: Normal heart sounds.  Pulmonary:     Effort: Pulmonary effort is normal.     Breath sounds: Normal breath sounds.    Alert and oriented with normal speech expression, fluency and comprehension. Visual fields are full to confrontation.   Face is symmetric. Strength in the arms and legs is symmetric with no drift.  Sensation is normal and symmetric. No ataxia. No inattention.   Imaging:  As I noted above the brain MRI and CT are negative.  The MRA shows a 4 mm narrow neck right posterior communicating artery  aneurysm.  Assessment and Plan:  I have explained the diagnosis of an unruptured aneurysm.  I have explained that aneurysms may bleed (rupture), and if this happens it can be fatal or disabling.  I have also explained that some aneurysms have a very low statistical risk of bleeding, in which case the risk of surgery may be higher than the risk of no surgery.  In her case, I estimate the risk of bleeding from the aneurysm around 1 %/year.  I estimate the surgical risk of endovascular repair around 3%.  Despite her age, she is very active.  She still works.  I have told her that I think either observation with follow-up imaging or repair of the aneurysm are both reasonable choices.  I suggested she consider the options and that we talk on the phone in a few days.

## 2023-12-15 DIAGNOSIS — Z6820 Body mass index (BMI) 20.0-20.9, adult: Secondary | ICD-10-CM | POA: Diagnosis not present

## 2023-12-15 DIAGNOSIS — S2242XK Multiple fractures of ribs, left side, subsequent encounter for fracture with nonunion: Secondary | ICD-10-CM | POA: Diagnosis not present

## 2023-12-15 DIAGNOSIS — R55 Syncope and collapse: Secondary | ICD-10-CM | POA: Diagnosis not present

## 2023-12-15 DIAGNOSIS — I671 Cerebral aneurysm, nonruptured: Secondary | ICD-10-CM | POA: Diagnosis not present

## 2023-12-16 ENCOUNTER — Ambulatory Visit (INDEPENDENT_AMBULATORY_CARE_PROVIDER_SITE_OTHER): Admitting: Neuroradiology

## 2023-12-16 DIAGNOSIS — I671 Cerebral aneurysm, nonruptured: Secondary | ICD-10-CM

## 2023-12-16 NOTE — Progress Notes (Signed)
 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  She has consider the options of surgery versus continued observation.  I have been clear that either option is reasonable, and that the choice depends on her preference.  She is very worried about the aneurysm and despite the information I gave her about the risk of bleeding of the aneurysm being relatively low, she prefers to have the aneurysm treated.  I think this can be accomplished by coiling.  We will schedule this in the near future.

## 2023-12-23 NOTE — Progress Notes (Deleted)
  Cardiology Office Note   Date:  12/23/2023  ID:  ROBYNNE ROAT, DOB 1955/09/01, MRN [id number] PCP: Crecencio, Chiquita POUR, FNP  [facility] Providers Cardiologist:  None { Click to update primary MD,subspecialty MD or APP then REFRESH:1}    History of Present Illness TRINITIE MCGIRR is a 68 y.o. female with a past medical history of brain aneurysm, hypertension, gallstones, type 2 diabetes, history of glaucoma.  Presents today for monitor follow-up.  Patient recently admitted 9/24 - 9/25 after she had a syncopal episode.  Patient had gotten out of bed at night to go to the bathroom and the next thing she knew she was on the bathroom floor.  In the ED, BMP and CBC grossly within normal limits.  High-sensitivity troponin negative.  CTA chest showed no acute PE, likely nondisplaced left anterior 6-8 rib fractures. EKG showed sinus rhythm with a right bundle branch block.  She was admitted for observation.  Underwent echocardiogram 12/03/2023 that showed EF 60-65% with no regional wall motion abnormalities, grade 1 DD.  There was a false tendon with mobile calcification present in the LV apex.  RV systolic function normal, size normal.  No significant valvular abnormalities.  She was discharged with a cardiac monitor which has not yet been completed ***  Syncope - Patient has syncopal episode 9/24.  She had gotten out of bed to go to the bathroom, next day she knew she was on the floor.  Workup in the ED overall reassuring.  Echo 12/03/2023 showed EF 60 to 65%, no regional wall motion abnormalities, normal RV systolic size and function.  No significant valvular abnormalities  Hypertension  Type 2 diabetes  ROS: ***  Studies Reviewed      *** Risk Assessment/Calculations {Does this patient have ATRIAL FIBRILLATION?:[phone]} No BP recorded.  {Refresh Note OR Click here to enter BP  :1}***       Physical Exam VS:  There were no vitals taken for this visit.       Wt Readings  from Last 3 Encounters:  12/10/23 113 lb 6.4 oz (51.4 kg)  12/03/23 116 lb 2.9 oz (52.7 kg)  11/25/23 115 lb (52.2 kg)    GEN: Well nourished, well developed in no acute distress NECK: No JVD; No carotid bruits CARDIAC: ***RRR, no murmurs, rubs, gallops RESPIRATORY:  Clear to auscultation without rales, wheezing or rhonchi  ABDOMEN: Soft, non-tender, non-distended EXTREMITIES:  No edema; No deformity   ASSESSMENT AND PLAN ***    {Are you ordering a CV Procedure (e.g. stress test, cath, DCCV, TEE, etc)?   Press F2        :[id number]}  Dispo: ***  Signed, Rollo FABIENE Louder, PA-C

## 2023-12-29 ENCOUNTER — Telehealth: Payer: Self-pay | Admitting: Neuroradiology

## 2023-12-29 NOTE — Telephone Encounter (Signed)
 Patient was seen in the clinic with Dr. Lester on 12/16/23.  Patient called the office today requesting an update on the status of scheduling a procedure with Dr. Lester.  Please contact patient to advise.

## 2023-12-29 NOTE — Telephone Encounter (Signed)
 Sheet placed on Dr. Wendall desk for CPT codes.

## 2023-12-30 ENCOUNTER — Other Ambulatory Visit: Payer: Self-pay

## 2023-12-30 DIAGNOSIS — I671 Cerebral aneurysm, nonruptured: Secondary | ICD-10-CM

## 2024-01-01 NOTE — Telephone Encounter (Signed)
 Scheduling request sent to [facility] IR scheduler.

## 2024-01-05 NOTE — Addendum Note (Signed)
 Addended by: Derelle Cockrell on: 01/05/2024 05:28 PM   Modules accepted: Orders

## 2024-01-06 ENCOUNTER — Ambulatory Visit: Attending: Cardiology | Admitting: Cardiology

## 2024-01-07 NOTE — Telephone Encounter (Signed)
 Request for scheduling was not sent. Error realized when checking on scheduling status. Urgent request sent request to [facility].

## 2024-01-08 ENCOUNTER — Telehealth: Payer: Self-pay

## 2024-01-08 DIAGNOSIS — I10 Essential (primary) hypertension: Secondary | ICD-10-CM | POA: Diagnosis not present

## 2024-01-08 DIAGNOSIS — E1136 Type 2 diabetes mellitus with diabetic cataract: Secondary | ICD-10-CM | POA: Diagnosis not present

## 2024-01-08 DIAGNOSIS — E782 Mixed hyperlipidemia: Secondary | ICD-10-CM | POA: Diagnosis not present

## 2024-01-08 NOTE — Telephone Encounter (Signed)
 Please see below for information in regards to your upcoming procedure.  Planned procedure : PCA Coiling of Aneurysm   Location: [address], ENTRANCE A  IR: DATE/ TIME AND LOCATION: they will tell you this information. [phone].   Pre-op appointment at [facility] Pre-admit Testing: you will receive a call with a date/time for this appointment. If you are scheduled for an in person appointment, Pre-admit Testing is located at Entrance A. You will enter and check in at patient registration. During this appointment, they will advise you which medications you can take the morning of surgery, and which medications you will need to hold for surgery. Labs (such as blood work, EKG) may be done at your pre-op appointment. You are not required to fast for these labs. Should you need to change your pre-op appointment, please call Pre-admit testing at  [phone].  Eating/ Drinking: You should not eat or drink after midnight (NPO)  before your procedure. You can have small sips of clear liquids with your morning medications but everything else should be avoided.  GLP-1 medications:  Injectables should be held x 7 days Dulaglutide (Trulicity)  How to contact us :  If you have any questions/concerns before or after your Procedure you can reach us  at [phone], or you can send a mychart message. We can be reached by phone or mychart 8am-5pm, Monday-Friday.   Registered Nurse: Tinnie HERO RN-BSN Surgery scheduler/Admin Lead: Rosaline Custard Medical Assistants: Jesslyn DEL CMA, Avelina BIRCH CMA Doctors: Dino Sable MD, Nancyann Burns MD

## 2024-01-08 NOTE — Telephone Encounter (Signed)
 Left voicemail for patient and send mychart message with contact for IR

## 2024-01-14 DIAGNOSIS — R634 Abnormal weight loss: Secondary | ICD-10-CM | POA: Diagnosis not present

## 2024-01-14 DIAGNOSIS — K625 Hemorrhage of anus and rectum: Secondary | ICD-10-CM | POA: Diagnosis not present

## 2024-01-14 DIAGNOSIS — R194 Change in bowel habit: Secondary | ICD-10-CM | POA: Diagnosis not present

## 2024-01-14 DIAGNOSIS — K838 Other specified diseases of biliary tract: Secondary | ICD-10-CM | POA: Diagnosis not present

## 2024-01-14 NOTE — Addendum Note (Signed)
 Addended by: Amori Cooperman on: 01/14/2024 04:25 PM   Modules accepted: Orders

## 2024-02-07 DIAGNOSIS — E1136 Type 2 diabetes mellitus with diabetic cataract: Secondary | ICD-10-CM | POA: Diagnosis not present

## 2024-02-07 DIAGNOSIS — E782 Mixed hyperlipidemia: Secondary | ICD-10-CM | POA: Diagnosis not present

## 2024-02-07 DIAGNOSIS — I10 Essential (primary) hypertension: Secondary | ICD-10-CM | POA: Diagnosis not present

## 2024-02-08 ENCOUNTER — Other Ambulatory Visit: Payer: Self-pay

## 2024-02-08 ENCOUNTER — Inpatient Hospital Stay (HOSPITAL_COMMUNITY)

## 2024-02-08 ENCOUNTER — Encounter (HOSPITAL_COMMUNITY): Payer: Self-pay | Admitting: Neuroradiology

## 2024-02-08 NOTE — Pre-Procedure Instructions (Signed)
 -------------  SDW INSTRUCTIONS given:  Your procedure is scheduled on 12/3.  Report to [facility] Main Entrance A at 08:15 A.M., and check in at the Admitting office.  Any questions or running late day of surgery: call [phone]    Remember:  Do not eat or drink after midnight the night before your surgery     Take these medicines the morning of surgery with A SIP OF WATER   acetaminophen  (TYLENOL )  aspirin   pantoprazole  (PROTONIX )    May take these medicines IF NEEDED: fluticasone (FLONASE SENSIMIST)  gabapentin  (NEURONTIN )  loratadine (CLARITIN)   As of today, STOP taking any Aleve, Naproxen, Ibuprofen , Motrin , Advil , Goody's, BC's, all herbal medications, fish oil, and all vitamins.  WHAT DO I DO ABOUT MY DIABETES MEDICATION?   STOP taking Dulaglutide (TRULICITY) 7 days prior to surgery.   HOW TO MANAGE YOUR DIABETES BEFORE AND AFTER SURGERY  Why is it important to control my blood sugar before and after surgery? Improving blood sugar levels before and after surgery helps healing and can limit problems. A way of improving blood sugar control is eating a healthy diet by:  Eating less sugar and carbohydrates  Increasing activity/exercise  Talking with your doctor about reaching your blood sugar goals High blood sugars (greater than 180 mg/dL) can raise your risk of infections and slow your recovery, so you will need to focus on controlling your diabetes during the weeks before surgery. Make sure that the doctor who takes care of your diabetes knows about your planned surgery including the date and location.  How do I manage my blood sugar before surgery? Check your blood sugar at least 4 times a day, starting 2 days before surgery, to make sure that the level is not too high or low.  Check your blood sugar the morning of your surgery when you wake up and every 2 hours until you get to the Short Stay unit.  If your blood sugar is less than 70 mg/dL, you will need to  treat for low blood sugar: Do not take insulin . Treat a low blood sugar (less than 70 mg/dL) with  cup of clear juice (cranberry or apple), 4 glucose tablets, OR glucose gel. Recheck blood sugar in 15 minutes after treatment (to make sure it is greater than 70 mg/dL). If your blood sugar is not greater than 70 mg/dL on recheck, call [phone] for further instructions. Report your blood sugar to the short stay nurse when you get to Short Stay.  If you are admitted to the hospital after surgery: Your blood sugar will be checked by the staff and you will probably be given insulin  after surgery (instead of oral diabetes medicines) to make sure you have good blood sugar levels. The goal for blood sugar control after surgery is 80-180 mg/dL.   Do NOT Smoke (Tobacco/Vaping) 24 hours prior to your procedure  If you use a CPAP at night, you may bring all equipment for your overnight stay.     You will be asked to remove any contacts, glasses, piercing's, hearing aid's, dentures/partials prior to surgery. Please bring cases for these items if needed.     Patients discharged the day of surgery will not be allowed to drive home, and someone needs to stay with them for 24 hours.  SURGICAL WAITING ROOM VISITATION Patients may have no more than 2 support people in the waiting area - these visitors may rotate.   Pre-op nurse will coordinate an appropriate time for 1 ADULT  support person, who may not rotate, to accompany patient in pre-op.  Children under the age of 63 must have an adult with them who is not the patient and must remain in the main waiting area with an adult.  If the patient needs to stay at the hospital during part of their recovery, the visitor guidelines for inpatient rooms apply.  Please refer to the [facility] website for the visitor guidelines for any additional information.   Special instructions:   [place]- Preparing For Surgery   Please follow these instructions  carefully.   Shower the NIGHT BEFORE SURGERY and the MORNING OF SURGERY with DIAL Soap.   Pat yourself dry with a CLEAN TOWEL.  Wear CLEAN PAJAMAS to bed the night before surgery  Place CLEAN SHEETS on your bed the night of your first shower and DO NOT SLEEP WITH PETS.   Additional instructions for the day of surgery: DO NOT APPLY any lotions, deodorants, cologne, or perfumes.   Do not wear jewelry or makeup Do not wear nail polish, gel polish, artificial nails, or any other type of covering on natural nails (fingers and toes) Do not bring valuables to the hospital. [facility] is not responsible for valuables/personal belongings. Put on clean/comfortable clothes.  Please brush your teeth.  Ask your nurse before applying any prescription medications to the skin.

## 2024-02-08 NOTE — Progress Notes (Signed)
 PCP - Chiquita Maize, FNP Cardiologist - denies  PPM/ICD - denies   Chest x-ray - 12/03/23 EKG - 12/02/23 Stress Test - denies ECHO - 12/03/23 Cardiac Cath - denies  CPAP - denies  Fasting Blood Sugar - 90-110 Checks Blood Sugar once/month Last dose of Trulicity 11/16  Blood Thinner Instructions: n/a Aspirin  Instructions: start ASA 2 days prior to surgery  ERAS Protcol - no, NPO  COVID TEST- n/a  Anesthesia review: no  Patient verbally denies any shortness of breath, fever, cough and chest pain during phone call      Questions were answered. Patient verbalized understanding of instructions.

## 2024-02-10 ENCOUNTER — Encounter (HOSPITAL_COMMUNITY): Admission: RE | Disposition: A | Payer: Self-pay | Source: Home / Self Care | Attending: Neuroradiology

## 2024-02-10 ENCOUNTER — Encounter (HOSPITAL_COMMUNITY): Payer: Self-pay | Admitting: Neuroradiology

## 2024-02-10 ENCOUNTER — Inpatient Hospital Stay (HOSPITAL_COMMUNITY): Admitting: Certified Registered Nurse Anesthetist

## 2024-02-10 ENCOUNTER — Other Ambulatory Visit: Payer: Self-pay

## 2024-02-10 ENCOUNTER — Inpatient Hospital Stay (HOSPITAL_COMMUNITY)
Admission: RE | Admit: 2024-02-10 | Discharge: 2024-02-10 | Disposition: A | Discharge status: Home / Self Care | Source: Ambulatory Visit | Attending: Neuroradiology | Admitting: Neuroradiology

## 2024-02-10 ENCOUNTER — Observation Stay (HOSPITAL_COMMUNITY)
Admission: RE | Admit: 2024-02-10 | Discharge: 2024-02-11 | DRG: 027 | Disposition: A | Attending: Neuroradiology | Admitting: Neuroradiology

## 2024-02-10 DIAGNOSIS — I671 Cerebral aneurysm, nonruptured: Secondary | ICD-10-CM

## 2024-02-10 DIAGNOSIS — I6031 Nontraumatic subarachnoid hemorrhage from right posterior communicating artery: Secondary | ICD-10-CM | POA: Diagnosis not present

## 2024-02-10 HISTORY — PX: IR ANGIOGRAM FOLLOW UP STUDY: IMG697

## 2024-02-10 HISTORY — PX: IR NEURO EACH ADD'L AFTER BASIC UNI RIGHT (MS): IMG5374

## 2024-02-10 HISTORY — PX: IR TRANSCATH/EMBOLIZ: IMG695

## 2024-02-10 HISTORY — PX: IR ANGIO INTRA EXTRACRAN SEL INTERNAL CAROTID UNI R MOD SED: IMG5362

## 2024-02-10 HISTORY — PX: RADIOLOGY WITH ANESTHESIA: SHX6223

## 2024-02-10 HISTORY — PX: IR US GUIDE VASC ACCESS RIGHT: IMG2390

## 2024-02-10 LAB — BASIC METABOLIC PANEL WITH GFR
Anion gap: 10 (ref 5–15)
BUN: 17 mg/dL (ref 8–23)
CO2: 24 mmol/L (ref 22–32)
Calcium: 9.2 mg/dL (ref 8.9–10.3)
Chloride: 108 mmol/L (ref 98–111)
Creatinine, Ser: 0.96 mg/dL (ref 0.44–1.00)
GFR, Estimated: >60 mL/min (ref >60)
Glucose, Bld: 97 mg/dL (ref 70–99)
Potassium: 4.5 mmol/L (ref 3.5–5.1)
Sodium: 142 mmol/L (ref 135–145)

## 2024-02-10 LAB — CBC
HCT: 37.5 % (ref 36.0–46.0)
Hemoglobin: 11.8 g/dL — ABNORMAL LOW (ref 12.0–15.0)
MCH: 32.7 pg (ref 26.0–34.0)
MCHC: 31.5 g/dL (ref 30.0–36.0)
MCV: 103.9 fL — ABNORMAL HIGH (ref 80.0–100.0)
Platelets: 218 K/uL (ref 150–400)
RBC: 3.61 MIL/uL — ABNORMAL LOW (ref 3.87–5.11)
RDW: 13.3 % (ref 11.5–15.5)
WBC: 4.2 K/uL (ref 4.0–10.5)
nRBC: 0 % (ref 0.0–0.2)

## 2024-02-10 LAB — GLUCOSE, CAPILLARY
Glucose-Capillary: 167 mg/dL — ABNORMAL HIGH (ref 70–99)
Glucose-Capillary: 192 mg/dL — ABNORMAL HIGH (ref 70–99)
Glucose-Capillary: 90 mg/dL (ref 70–99)
Glucose-Capillary: 91 mg/dL (ref 70–99)
Glucose-Capillary: 93 mg/dL (ref 70–99)
Glucose-Capillary: 98 mg/dL (ref 70–99)

## 2024-02-10 LAB — POCT ACTIVATED CLOTTING TIME: Activated Clotting Time: 214 s

## 2024-02-10 SURGERY — RADIOLOGY WITH ANESTHESIA
Anesthesia: General

## 2024-02-10 MED ORDER — IOHEXOL 300 MG/ML  SOLN
150.0000 mL | Freq: Once | INTRAMUSCULAR | Status: AC | PRN
  Administered 2024-02-10: 30 mL via INTRA_ARTERIAL

## 2024-02-10 MED ORDER — INSULIN ASPART 100 UNIT/ML IJ SOLN: 0.0000 [IU] | INTRAMUSCULAR | Status: DC | PRN

## 2024-02-10 MED ORDER — FENTANYL CITRATE (PF) 250 MCG/5ML IJ SOLN
INTRAMUSCULAR | Status: DC | PRN
  Administered 2024-02-10 (×2): 50 ug via INTRAVENOUS

## 2024-02-10 MED ORDER — VERAPAMIL HCL 2.5 MG/ML IV SOLN: INTRA_ARTERIAL | Status: AC | PRN

## 2024-02-10 MED ORDER — SODIUM CHLORIDE 0.9 % IV BOLUS: 250.0000 mL | INTRAVENOUS | Status: DC | PRN

## 2024-02-10 MED ORDER — LACTATED RINGERS IV SOLN: INTRAVENOUS | Status: DC

## 2024-02-10 MED ORDER — BENAZEPRIL HCL 5 MG PO TABS
10.0000 mg | ORAL_TABLET | Freq: Every day | ORAL | Status: DC
  Administered 2024-02-11: 10 mg via ORAL
  Filled 2024-02-10: qty 2

## 2024-02-10 MED ORDER — SUGAMMADEX SODIUM 200 MG/2ML IV SOLN
INTRAVENOUS | Status: DC | PRN
  Administered 2024-02-10: 200 mg via INTRAVENOUS

## 2024-02-10 MED ORDER — BENAZEPRIL-HYDROCHLOROTHIAZIDE 10-12.5 MG PO TABS: 1.0000 | ORAL_TABLET | Freq: Every morning | ORAL | Status: DC

## 2024-02-10 MED ORDER — PANTOPRAZOLE SODIUM 40 MG PO TBEC
40.0000 mg | DELAYED_RELEASE_TABLET | Freq: Every day | ORAL | Status: DC
  Administered 2024-02-11: 40 mg via ORAL
  Filled 2024-02-10: qty 1

## 2024-02-10 MED ORDER — LATANOPROST 0.005 % OP SOLN
1.0000 [drp] | Freq: Every day | OPHTHALMIC | Status: DC
  Administered 2024-02-10: 1 [drp] via OPHTHALMIC
  Filled 2024-02-10: qty 2.5

## 2024-02-10 MED ORDER — FENTANYL CITRATE (PF) 100 MCG/2ML IJ SOLN: 25.0000 ug | INTRAMUSCULAR | Status: DC | PRN

## 2024-02-10 MED ORDER — ROCURONIUM BROMIDE 10 MG/ML (PF) SYRINGE
PREFILLED_SYRINGE | INTRAVENOUS | Status: DC | PRN
  Administered 2024-02-10: 10 mg via INTRAVENOUS
  Administered 2024-02-10: 50 mg via INTRAVENOUS

## 2024-02-10 MED ORDER — DOCUSATE SODIUM 100 MG PO CAPS: 100.0000 mg | ORAL_CAPSULE | Freq: Two times a day (BID) | ORAL | Status: DC | PRN

## 2024-02-10 MED ORDER — MAGNESIUM 200 MG PO TABS: 200.0000 mg | ORAL_TABLET | Freq: Every day | ORAL | Status: DC

## 2024-02-10 MED ORDER — MAGNESIUM OXIDE -MG SUPPLEMENT 400 (240 MG) MG PO TABS
200.0000 mg | ORAL_TABLET | Freq: Every day | ORAL | Status: DC
  Administered 2024-02-10: 200 mg via ORAL
  Filled 2024-02-10 (×2): qty 1

## 2024-02-10 MED ORDER — SODIUM CHLORIDE 0.9 % IV SOLN: INTRAVENOUS | Status: AC

## 2024-02-10 MED ORDER — VERAPAMIL HCL 2.5 MG/ML IV SOLN
INTRAVENOUS | Status: AC
  Filled 2024-02-10: qty 2

## 2024-02-10 MED ORDER — INSULIN ASPART 100 UNIT/ML IJ SOLN
0.0000 [IU] | INTRAMUSCULAR | Status: DC
  Administered 2024-02-10 – 2024-02-11 (×3): 2 [IU] via SUBCUTANEOUS
  Filled 2024-02-10: qty 2
  Filled 2024-02-10: qty 1
  Filled 2024-02-10: qty 2

## 2024-02-10 MED ORDER — GABAPENTIN 300 MG PO CAPS
600.0000 mg | ORAL_CAPSULE | Freq: Two times a day (BID) | ORAL | Status: DC | PRN
  Administered 2024-02-11: 600 mg via ORAL
  Filled 2024-02-10: qty 2

## 2024-02-10 MED ORDER — ACETAMINOPHEN 650 MG RE SUPP: 650.0000 mg | RECTAL | Status: DC | PRN

## 2024-02-10 MED ORDER — DEXAMETHASONE SOD PHOSPHATE PF 10 MG/ML IJ SOLN
INTRAMUSCULAR | Status: DC | PRN
  Administered 2024-02-10: 5 mg via INTRAVENOUS

## 2024-02-10 MED ORDER — CHLORHEXIDINE GLUCONATE 0.12 % MT SOLN
15.0000 mL | Freq: Once | OROMUCOSAL | Status: AC
  Administered 2024-02-10: 15 mL via OROMUCOSAL
  Filled 2024-02-10: qty 15

## 2024-02-10 MED ORDER — DULAGLUTIDE 0.75 MG/0.5ML ~~LOC~~ SOAJ: 0.7500 mg | SUBCUTANEOUS | Status: DC

## 2024-02-10 MED ORDER — PHENYLEPHRINE HCL-NACL 20-0.9 MG/250ML-% IV SOLN
INTRAVENOUS | Status: DC | PRN
  Administered 2024-02-10: 40 ug/min via INTRAVENOUS

## 2024-02-10 MED ORDER — FENTANYL CITRATE (PF) 100 MCG/2ML IJ SOLN
INTRAMUSCULAR | Status: AC
  Filled 2024-02-10: qty 2

## 2024-02-10 MED ORDER — HYDROCHLOROTHIAZIDE 12.5 MG PO TABS: 12.5000 mg | ORAL_TABLET | Freq: Every day | ORAL | Status: DC

## 2024-02-10 MED ORDER — LIDOCAINE 2% (20 MG/ML) 5 ML SYRINGE
INTRAMUSCULAR | Status: DC | PRN
  Administered 2024-02-10: 80 mg via INTRAVENOUS

## 2024-02-10 MED ORDER — HEPARIN SODIUM (PORCINE) 1000 UNIT/ML IJ SOLN
INTRAMUSCULAR | Status: DC | PRN
  Administered 2024-02-10: 4000 [IU] via INTRAVENOUS
  Administered 2024-02-10: 1000 [IU] via INTRAVENOUS

## 2024-02-10 MED ORDER — ACETAMINOPHEN 160 MG/5ML PO SOLN: 650.0000 mg | ORAL | Status: DC | PRN

## 2024-02-10 MED ORDER — ACETAMINOPHEN 325 MG PO TABS
650.0000 mg | ORAL_TABLET | ORAL | Status: DC | PRN
  Administered 2024-02-10: 650 mg via ORAL

## 2024-02-10 MED ORDER — ORAL CARE MOUTH RINSE: 15.0000 mL | Freq: Once | OROMUCOSAL | Status: AC

## 2024-02-10 MED ORDER — CLEVIDIPINE BUTYRATE 0.5 MG/ML IV EMUL: 0.0000 mg/h | INTRAVENOUS | Status: DC

## 2024-02-10 MED ORDER — ACETAMINOPHEN 500 MG PO TABS: 1000.0000 mg | ORAL_TABLET | Freq: Once | ORAL | Status: DC

## 2024-02-10 MED ORDER — NITROGLYCERIN 1 MG/10 ML FOR IR/CATH LAB
INTRA_ARTERIAL | Status: AC
  Filled 2024-02-10: qty 10

## 2024-02-10 MED ORDER — ONDANSETRON HCL 4 MG/2ML IJ SOLN
INTRAMUSCULAR | Status: DC | PRN
  Administered 2024-02-10: 4 mg via INTRAVENOUS

## 2024-02-10 MED ORDER — PROPOFOL 10 MG/ML IV BOLUS
INTRAVENOUS | Status: DC | PRN
  Administered 2024-02-10: 100 mg via INTRAVENOUS

## 2024-02-10 NOTE — Transfer of Care (Signed)
 Immediate Anesthesia Transfer of Care Note  Patient: [name]  Procedure(s) Performed: RADIOLOGY WITH ANESTHESIA  Patient Location: PACU  Anesthesia Type:General  Level of Consciousness: awake and alert   Airway & Oxygen Therapy: Patient Spontanous Breathing  Post-op Assessment: Report given to RN and Post -op Vital signs reviewed and stable  Post vital signs: Reviewed and stable  Last Vitals:  Vitals Value Taken Time  BP 150/71   Temp 36.3 C 02/10/24 15:26  Pulse 81   Resp 15   SpO2 100     Last Pain:  Vitals:   02/10/24 0857  TempSrc:   PainSc: 0-No pain         Complications: No notable events documented.

## 2024-02-10 NOTE — Sedation Documentation (Signed)
 Bedside report given to RN. Radial site assessed - Level 0, no hematoma, dressing is clean, dry, and intact. Pulses also assessed bilaterally.

## 2024-02-10 NOTE — Sedation Documentation (Signed)
ACT 214

## 2024-02-10 NOTE — Anesthesia Preprocedure Evaluation (Addendum)
 Anesthesia Evaluation  Patient identified by MRN, date of birth, ID band Patient awake    Reviewed: Allergy & Precautions, H&P , NPO status , Patient's Chart, lab work & pertinent test results  Airway Mallampati: II  TM Distance: >3 FB Neck ROM: Full    Dental no notable dental hx. (+) Edentulous Upper, Edentulous Lower, Dental Advisory Given   Pulmonary neg pulmonary ROS   Pulmonary exam normal breath sounds clear to auscultation       Cardiovascular hypertension, Pt. on medications  Rhythm:Regular Rate:Normal     Neuro/Psych  Headaches  negative psych ROS   GI/Hepatic Neg liver ROS,GERD  Medicated,,  Endo/Other  diabetes, Type 2    Renal/GU negative Renal ROS  negative genitourinary   Musculoskeletal  (+) Arthritis , Osteoarthritis,    Abdominal   Peds  Hematology negative hematology ROS (+)   Anesthesia Other Findings   Reproductive/Obstetrics negative OB ROS                              Anesthesia Physical Anesthesia Plan  ASA: 2  Anesthesia Plan: General   Post-op Pain Management: Tylenol  PO (pre-op)*   Induction: Intravenous  PONV Risk Score and Plan: 4 or greater and Ondansetron , Dexamethasone  and Treatment may vary due to age or medical condition  Airway Management Planned: Oral ETT  Additional Equipment:   Intra-op Plan:   Post-operative Plan: Extubation in OR  Informed Consent: I have reviewed the patients History and Physical, chart, labs and discussed the procedure including the risks, benefits and alternatives for the proposed anesthesia with the patient or authorized representative who has indicated his/her understanding and acceptance.     Dental advisory given  Plan Discussed with: CRNA  Anesthesia Plan Comments:          Anesthesia Quick Evaluation

## 2024-02-10 NOTE — Anesthesia Postprocedure Evaluation (Signed)
 Anesthesia Post Note  Patient: [name]  Procedure(s) Performed: RADIOLOGY WITH ANESTHESIA     Patient location during evaluation: PACU Anesthesia Type: General Level of consciousness: awake and alert Pain management: pain level controlled Vital Signs Assessment: post-procedure vital signs reviewed and stable Respiratory status: spontaneous breathing, nonlabored ventilation and respiratory function stable Cardiovascular status: blood pressure returned to baseline and stable Postop Assessment: no apparent nausea or vomiting Anesthetic complications: no   No notable events documented.               Ladarrell Cornwall,W. EDMOND

## 2024-02-10 NOTE — Sedation Documentation (Signed)
 Patient moved to table by staff , secured, hooked to monitors, and now under the care of anesthesia. Please see charting in vitals per CRNA.

## 2024-02-10 NOTE — Anesthesia Procedure Notes (Signed)
 Procedure Name: Intubation Date/Time: 02/10/2024 1:47 PM  Performed by: Atanacio Arland HERO, CRNAPre-anesthesia Checklist: Patient identified, Emergency Drugs available, Suction available and Patient being monitored Patient Re-evaluated:Patient Re-evaluated prior to induction Oxygen Delivery Method: Circle System Utilized Preoxygenation: Pre-oxygenation with 100% oxygen Induction Type: IV induction Ventilation: Mask ventilation without difficulty Laryngoscope Size: Mac and 4 Grade View: Grade I Tube type: Oral Tube size: 7.0 mm Number of attempts: 1 Airway Equipment and Method: Stylet and Oral airway Placement Confirmation: ETT inserted through vocal cords under direct vision, positive ETCO2 and breath sounds checked- equal and bilateral Secured at: 21 cm Tube secured with: Tape Dental Injury: Teeth and Oropharynx as per pre-operative assessment

## 2024-02-10 NOTE — Progress Notes (Signed)
 NAME:  [name], MRN:  [id number], DOB:  Jun 04, 1955, LOS: 1 ADMISSION DATE:  02/10/2024, CONSULTATION DATE:  02/10/2024 REFERRING MD:  Dr. Lester, CHIEF COMPLAINT:  Pcomm aneurysm    History of Present Illness:  [name] is a 68 year old female with history of HTN, cholelithiasis, CBD dilatation s/p EUS (11/25/2023), T2DM, glaucoma who was recently admitted for syncope and incidentally found to have right posterior communicating artery aneurysm. She is now s/p elective coiling of posterior communicating aneurysm with Dr. Lester on 02/10/2024.   Post-IR she was admitted to the ICU for close neuro and hemodynamic monitoring. PCCM was consulted in this context.   Pertinent  Medical History  HTN Cholelithiasis  CBD dilatation s/p EUS (11/25/2023) T2DM Glaucoma   Significant Hospital Events: Including procedures, antibiotic start and stop dates in addition to other pertinent events   02/10/2024: IR s/p coiling of right Pcomm aneurysm   Interim History / Subjective:  Arrived to ICU on no drips. Hemodynamically stable and neuro intact. Complains of mild headache. Right radial access site with TR band, no evidence of bleeding or hematoma.   Objective    Blood pressure (!) 147/65, pulse 67, temperature 98.3 F (36.8 C), resp. rate 14, height 5' 3 (1.6 m), weight 51.3 kg, SpO2 98%.        Intake/Output Summary (Last 24 hours) at 02/10/2024 1637 Last data filed at 02/10/2024 1527 Gross per 24 hour  Intake 500 ml  Output 20 ml  Net 480 ml   Filed Weights   02/08/24 1118 02/10/24 0827  Weight: 51.3 kg 51.3 kg    Examination: General: no acute distress HENT: [place]/AT, PERRL, EOMI Lungs: breathing comfortably on room air, clear to auscultation bilaterally Cardiovascular: regular rate and rhythm, normal S1 and S2 Abdomen: soft, non-tender, non-distended, +BS Extremities: warm, dry, no edema, right radial access site with TR band no evidence of bleeding or hematoma Neuro: alert and  oriented x 4, follows commands throughout with 5/5 strength  GU: deferred  Resolved problem list   Assessment and Plan   Pcomm aneurysm s/p coiling - Post-IR management per neurosurgery - SBP goal<160, clevidipine as needed  - Frequent neuro monitoring   Headache - PRN APAP, can add Fioricet if needed   History of HTN - Continue home benazepril -hydrochlorothiazide  - SBP goal per above  History of GERD - Continue home PPI  History of T2DM - Holding home Trulicity  - Start SSI, CBG goal 140-180  Labs   CBC: Recent Labs  Lab 02/10/24 0905  WBC 4.2  HGB 11.8*  HCT 37.5  MCV 103.9*  PLT 218    Basic Metabolic Panel: Recent Labs  Lab 02/10/24 0905  NA 142  K 4.5  CL 108  CO2 24  GLUCOSE 97  BUN 17  CREATININE 0.96  CALCIUM 9.2   GFR: Estimated Creatinine Clearance: 44.2 mL/min (by C-G formula based on SCr of 0.96 mg/dL). Recent Labs  Lab 02/10/24 0905  WBC 4.2    Liver Function Tests: No results for input(s): AST, ALT, ALKPHOS, BILITOT, PROT, ALBUMIN in the last 168 hours. No results for input(s): LIPASE, AMYLASE in the last 168 hours. No results for input(s): AMMONIA in the last 168 hours.  ABG No results found for: PHART, PCO2ART, PO2ART, HCO3, TCO2, ACIDBASEDEF, O2SAT   Coagulation Profile: No results for input(s): INR, PROTIME in the last 168 hours.  Cardiac Enzymes: No results for input(s): CKTOTAL, CKMB, CKMBINDEX, TROPONINI in the last 168 hours.  HbA1C:  Hgb A1c MFr Bld  Date/Time Value Ref Range Status  12/03/2023 05:38 AM 5.6 4.8 - 5.6 % Final    Comment:    (NOTE) Diagnosis of Diabetes The following HbA1c ranges recommended by the American Diabetes Association (ADA) may be used as an aid in the diagnosis of diabetes mellitus.  Hemoglobin             Suggested A1C NGSP%              Diagnosis  <5.7                   Non Diabetic  5.7-6.4                Pre-Diabetic  >6.4                    Diabetic  <7.0                   Glycemic control for                       adults with diabetes.    01/28/2022 09:45 AM 5.9 (H) 4.8 - 5.6 % Final    Comment:    (NOTE) Pre diabetes:          5.7%-6.4%  Diabetes:              >6.4%  Glycemic control for   <7.0% adults with diabetes     CBG: Recent Labs  Lab 02/10/24 0829 02/10/24 1026 02/10/24 1238 02/10/24 1530  GLUCAP 93 90 91 98    Review of Systems:   Complains of mild headache, otherwise negative  Past Medical History:  She,  has a past medical history of Arthritis, Diabetes mellitus without complication (HCC), GERD (gastroesophageal reflux disease), Hypertension, and Leg cramps, sleep related.   Surgical History:   Past Surgical History:  Procedure Laterality Date   ESOPHAGOGASTRODUODENOSCOPY N/A 11/25/2023   Procedure: EGD (ESOPHAGOGASTRODUODENOSCOPY);  Surgeon: Burnette Fallow, MD;  Location: [facility];  Service: Gastroenterology;  Laterality: N/A;   EUS N/A 11/25/2023   Procedure: ULTRASOUND, UPPER GI TRACT, ENDOSCOPIC;  Surgeon: Burnette Fallow, MD;  Location: [facility];  Service: Gastroenterology;  Laterality: N/A;   FOOT SURGERY Left    bunion with retained pin   LUMBAR LAMINECTOMY/DECOMPRESSION MICRODISCECTOMY Right 03/01/2014   Procedure: MICRO LUMBAR DECOMPRESSION L3 - L4 and L4 - L5 ON THE RIGHT  2 LEVELS;  Surgeon: Reyes JAYSON Billing, MD;  Location: [facility];  Service: Orthopedics;  Laterality: Right;   LUMBAR LAMINECTOMY/DECOMPRESSION MICRODISCECTOMY Left 03/18/2017   Procedure: Revision Microlumbar decompression L5-S1 left;  Surgeon: Billing Reyes, MD;  Location: [facility];  Service: Orthopedics;  Laterality: Left;  120 mins   ROTATOR CUFF REPAIR Left    TONSILLECTOMY     as a child   TOTAL KNEE ARTHROPLASTY Left 02/05/2022   Procedure: TOTAL KNEE ARTHROPLASTY;  Surgeon: Billing Reyes, MD;  Location: [facility];  Service: Orthopedics;  Laterality: Left;   TUBAL LIGATION  1980     Social  History:   reports that she has never smoked. She has never used smokeless tobacco. She reports that she does not currently use alcohol. She reports that she does not use drugs.   Family History:  Her family history includes Cancer in her mother; Heart disease in her sister. There is no history of Breast cancer.   Allergies Allergies  Allergen Reactions   Rosuvastatin Other (See Comments)  Leg cramps     Home Medications  Prior to Admission medications   Medication Sig Start Date End Date Taking? Authorizing Provider  acetaminophen  (TYLENOL ) 500 MG tablet Take 1,500-2,000 mg by mouth in the morning, at noon, and at bedtime.   Yes [provider]  aspirin  325 MG tablet Take 325 mg by mouth daily.   Yes [provider]  benazepril -hydrochlorthiazide (LOTENSIN  HCT) 10-12.5 MG per tablet Take 1 tablet by mouth every morning.   Yes [provider]  Dulaglutide  (TRULICITY ) 0.75 MG/0.5ML SOPN Inject 0.75 mg into the skin every Sunday.   Yes [provider]  fluticasone (FLONASE SENSIMIST) 27.5 MCG/SPRAY nasal spray Place 2 sprays into the nose daily as needed for rhinitis or allergies.   Yes [provider]  gabapentin  (NEURONTIN ) 600 MG tablet Take 600 mg by mouth 2 (two) times daily as needed (Pain).   Yes [provider]  latanoprost  (XALATAN ) 0.005 % ophthalmic solution Place 1 drop into both eyes at bedtime. 01/22/17  Yes [provider]  loratadine (CLARITIN) 10 MG tablet Take 10 mg by mouth daily as needed for allergies.   Yes [provider]  Magnesium  200 MG TABS Take 200 mg by mouth daily.   Yes [provider]  pantoprazole  (PROTONIX ) 40 MG tablet Take 40 mg by mouth daily before breakfast.   Yes [provider]  docusate sodium  (COLACE) 100 MG capsule Take 1 capsule (100 mg total) by mouth 2 (two) times daily as needed for mild constipation. 02/05/22   Duwayne Purchase, MD         Rexene LOISE Blush, PA-C [facility] 02/10/24 4:54 PM  Please see [email or web address] for pager details.  From 7A-7P if no response, please call [phone] After hours, please call ELink [phone]

## 2024-02-10 NOTE — Brief Op Note (Signed)
  NEUROSURGERY BRIEF OP NOTE   PREOP DX: pcomm aneurysm  POSTOP DX: Same  PROCEDURE: coiling  SURGEON: Nancyann LULLA Burns   ANESTHESIA: GETA  EBL: 50  COMPLICATIONS: No immediate  CONDITION: Stable  FINDINGS:  4 mm pcomm with irregular contour, coiled  Nancyann LULLA Burns  @today @ 3:01 PM

## 2024-02-10 NOTE — Sedation Documentation (Signed)
 Patient transported to recovery area via stretcher with CRNA.

## 2024-02-10 NOTE — H&P (Signed)
 Stamey, Chiquita POUR, FNP [address] [place],  [place] [address]   Primary Physician:  Crecencio Chiquita POUR, FNP   Chief Complaint:   Brain aneurysm   History of Present Illness: [name] is a 68 y.o. female who presents with the chief complaint of an incidentally detected brain aneurysm.   She had an episode of syncope which prompted neuroimaging.  This included a head CT, brain MRI and MR angiogram.  I reviewed all of those studies personally.   The head CT and MR eye were negative.  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  It appears to have a narrow neck.   She has not had any headaches suggestive of subarachnoid hemorrhage.   She has a past medical history of well-controlled hypertension and diabetes.       Review of Systems:   Review of Systems  Respiratory:  Negative for shortness of breath.   Cardiovascular:  Negative for chest pain.  Gastrointestinal:  Negative for abdominal pain.     Exam:    Today's Vitals    12/10/23 1259  BP: 137/84  Pulse: 77  SpO2: 98%  Weight: 113 lb 6.4 oz (51.4 kg)  Height: 5' 3.25 (1.607 m)  PainSc: 2   PainLoc: Head    Body mass index is 19.93 kg/m.     Physical Exam Constitutional:      Appearance: Normal appearance. She is normal weight.  Cardiovascular:     Rate and Rhythm: Normal rate and regular rhythm.     Pulses: Normal pulses.     Heart sounds: Normal heart sounds.  Pulmonary:     Effort: Pulmonary effort is normal.     Breath sounds: Normal breath sounds.    Alert and oriented with normal speech expression, fluency and comprehension. Visual fields are full to confrontation.   Face is symmetric. Strength in the arms and legs is symmetric with no drift.  Sensation is normal and symmetric. No ataxia. No inattention.    Imaging:   As I noted above the brain MRI and CT are negative.  The MRA shows a 4 mm narrow neck right posterior communicating artery aneurysm.    Assessment and Plan:   I have explained the diagnosis of an unruptured aneurysm.  I have explained that aneurysms may bleed (rupture), and if this happens it can be fatal or disabling.  I have also explained that some aneurysms have a very low statistical risk of bleeding, in which case the risk of surgery may be higher than the risk of no surgery.   In her case, I estimate the risk of bleeding from the aneurysm around 1 %/year.  I estimate the surgical risk of endovascular repair around 3%.   Despite her age, she is very active.  She still works.  I have told her that I think either observation with follow-up imaging or repair of the aneurysm are both reasonable choices.  I suggested she consider the options and that we talk on the phone in a few days.        Electronically signed by Lester Golas, MD at 12/10/2023  5:06 PM  I spoke with [name] on the telephone today for follow-up of our consultation regarding her 4 mm right posterior communicating artery aneurysm.   She has consider the options of surgery versus continued observation.  I have been clear that either option is reasonable, and that the choice depends on her preference.  She is very worried about the aneurysm and despite the information I gave her about the risk of bleeding of the aneurysm being relatively low, she prefers to have the aneurysm treated.   I think this can be accomplished by coiling.  We will schedule this in the near future.        Electronically signed by Lester Golas, MD at 12/16/2023  4:58 PM  I have reviewed and confirmed my history and physical from 12/16/23 with no additions or changes. Plan for right pcomm aneurysm coiling.  Risks and benefits reviewed.  She feels well Lungs clear. Heart sounds normal Abdomen soft Right radial and pedal pulses are normal

## 2024-02-11 ENCOUNTER — Telehealth: Payer: Self-pay

## 2024-02-11 ENCOUNTER — Encounter (HOSPITAL_COMMUNITY): Payer: Self-pay | Admitting: Neuroradiology

## 2024-02-11 LAB — GLUCOSE, CAPILLARY
Glucose-Capillary: 131 mg/dL — ABNORMAL HIGH (ref 70–99)
Glucose-Capillary: 108 mg/dL — ABNORMAL HIGH (ref 70–99)
Glucose-Capillary: 268 mg/dL — ABNORMAL HIGH (ref 70–99)

## 2024-02-11 MED ORDER — ASPIRIN 81 MG PO TBEC: 81.0000 mg | DELAYED_RELEASE_TABLET | Freq: Every day | ORAL | 0 refills | Status: AC

## 2024-02-11 MED ORDER — INSULIN ASPART 100 UNIT/ML IJ SOLN: 0.0000 [IU] | Freq: Three times a day (TID) | INTRAMUSCULAR | Status: DC

## 2024-02-11 NOTE — Telephone Encounter (Signed)
 Left voicemail to schedule patient's one month post op with Dr. Lester. Patient to call back the office to schedule.

## 2024-02-11 NOTE — Progress Notes (Signed)
  History  Ms. Rudman is a 68 year old female with history of HTN, cholelithiasis, CBD dilatation s/p EUS (11/25/2023), T2DM, glaucoma who was recently admitted for syncope and incidentally found to have right posterior communicating artery aneurysm.   12/3 She received an elective coiling of posterior communicating aneurysm with Dr. Lester. Post-IR she was admitted to the ICU for close neuro and hemodynamic monitoring. At the time she had no neuro deficits nor changes in condition.   12/4 Today the patient remains unchanged. No neuro deficits. No significant events last night.     Assessment/Plan: The patient demonstrates appropriate postoperative recovery with an intact neurological exam and no new deficits. Site is clean and dry, pain is adequately managed, and there are no signs of infection or complication at this time.   LOS: 1 day  Get patient up and walking early Discharge today   Subjective: Patient denies any concern at this time. Slept well through the night though the BP cuff bothered her a little.   Objective: Vital signs in last 24 hours: Temp:  [97.4 F (36.3 C)-98.4 F (36.9 C)] 98.1 F (36.7 C) (12/04 0800) Pulse Rate:  [67-84] 84 (12/04 0800) Resp:  [11-22] 16 (12/04 0800) BP: (109-158)/(51-115) 119/66 (12/04 0800) SpO2:  [95 %-100 %] 97 % (12/04 0800)  Intake/Output from previous day: 12/03 0701 - 12/04 0700 In: 1303 [P.O.:120; I.V.:1183] Out: 920 [Urine:900; Blood:20] Intake/Output this shift: No intake/output data recorded.  PHYSICAL EXAM  HENT:     Head: Normocephalic.     Nose: Nose normal.  Eyes:     Pupils: Pupils are equal, round, and reactive to light.  Cardiovascular:     Rate and Rhythm: Normal rate.  Pulmonary:     Effort: Pulmonary effort is normal.  Abdominal:     General: Abdomen is flat.  Musculoskeletal:     Cervical back: Normal range of motion.  Neurological:     Mental Status: Patient is alert and oriented.     Cranial Nerves:  Cranial nerves 2-12 are intact.     Sensory: Sensation is intact.     Motor: Motor function is intact.    Coordination: Coordination is intact.  Skin:     Site is clean and dry, pain is adequately managed, and there are no signs of infection or complication at this time.    Lab Results: Recent Labs    02/10/24 0905  WBC 4.2  HGB 11.8*  HCT 37.5  PLT 218   BMET Recent Labs    02/10/24 0905  NA 142  K 4.5  CL 108  CO2 24  GLUCOSE 97  BUN 17  CREATININE 0.96  CALCIUM 9.2    Studies/Results: No results found.    Jayson PARAS Marios Gaiser 02/11/2024, 10:24 AM

## 2024-02-11 NOTE — Discharge Summary (Signed)
 Physician Discharge Summary   Patient: [name] MRN: [id number] DOB: Jun 18, 1955  Admit date:     02/10/2024  Discharge date: 02/11/24  Discharge Physician: Jayson JINNY Eis   PCP: Crecencio Chiquita POUR, FNP    Recommendations at discharge:    Discharge Instructions      Continue home medications  Tylenol  for mild pain Start taking aspirin  81mg  daily for 1 month. Take constipation medications so that you are not straining, some combination of over-the-counter miralax , senna, prune juice, or whatever works for you at home Mobilize as able, this is very important for overall recovery If you notice any new neurological symptoms -- such as weakness, numbness, trouble speaking, vision changes, or loss of balance -- call the clinic right away. Follow up in clinic in 1 month      Discharge Diagnoses: Principal Problem:   Cerebral aneurysm Active Problems:   Brain aneurysm  Resolved Problems:   * No resolved hospital problems. [facility] Course: Patient admitted on 02/10/2024  for elective coiling of posterior communicating aneurysm. Operation was uncomplicated. Postop scan and exam showed no complications. Ambulating, pain controlled, tolerating diet, and voiding at time of discharge. Will follow up in [facility] Neurosurgery Team clinic with instructions provided as above. Post-hospitalization questions to be answered by [facility] Neurosurgery team.  Disposition: Home  DISCHARGE MEDICATION: Allergies as of 02/11/2024       Reactions   Rosuvastatin Other (See Comments)   Leg cramps        Medication List     STOP taking these medications    aspirin  325 MG tablet Replaced by: aspirin  EC 81 MG tablet       TAKE these medications    acetaminophen  500 MG tablet Commonly known as: TYLENOL  Take 1,500-2,000 mg by mouth in the morning, at noon, and at bedtime.   aspirin  EC 81 MG tablet Take 1 tablet (81 mg total) by mouth daily. Swallow whole. Replaces: aspirin   325 MG tablet   benazepril -hydrochlorthiazide 10-12.5 MG tablet Commonly known as: LOTENSIN  HCT Take 1 tablet by mouth every morning.   docusate sodium  100 MG capsule Commonly known as: Colace Take 1 capsule (100 mg total) by mouth 2 (two) times daily as needed for mild constipation.   Flonase Sensimist 27.5 MCG/SPRAY nasal spray Generic drug: fluticasone Place 2 sprays into the nose daily as needed for rhinitis or allergies.   gabapentin  600 MG tablet Commonly known as: NEURONTIN  Take 600 mg by mouth 2 (two) times daily as needed (Pain).   latanoprost  0.005 % ophthalmic solution Commonly known as: XALATAN  Place 1 drop into both eyes at bedtime.   loratadine 10 MG tablet Commonly known as: CLARITIN Take 10 mg by mouth daily as needed for allergies.   Magnesium  200 MG Tabs Take 200 mg by mouth daily.   pantoprazole  40 MG tablet Commonly known as: PROTONIX  Take 40 mg by mouth daily before breakfast.   Trulicity 0.75 MG/0.5ML Soaj Generic drug: Dulaglutide Inject 0.75 mg into the skin every Sunday.         Discharge Exam: No distress Abd soft Lungs clear  PHYSICAL EXAM  HENT:     Head: Normocephalic.     Nose: Nose normal.  Eyes:     Pupils: Pupils are equal, round, and reactive to light.  Cardiovascular:     Rate and Rhythm: Normal rate.  Pulmonary:     Effort: Pulmonary effort is normal.  Abdominal:     General: Abdomen is  flat.  Musculoskeletal:     Cervical back: Normal range of motion.  Neurological:     Mental Status: Patient is alert and oriented.        Cranial Nerves: Cranial nerves 2-12 are intact.     Sensory: Sensation is intact.     Motor: Motor function is intact.    Coordination: Coordination is intact.  Skin:     Incision site looks CDI   Condition at discharge: Stable  Time spent on discharge: <48min

## 2024-02-11 NOTE — Plan of Care (Signed)
  Problem: Nutrition: Goal: Adequate nutrition will be maintained Outcome: Progressing   Problem: Coping: Goal: Level of anxiety will decrease Outcome: Progressing   Problem: Pain Managment: Goal: General experience of comfort will improve and/or be controlled Outcome: Progressing   Problem: Coping: Goal: Ability to adjust to condition or change in health will improve Outcome: Progressing

## 2024-02-11 NOTE — Progress Notes (Signed)
 NAME:  [name], MRN:  [id number], DOB:  06/19/1955, LOS: 1 ADMISSION DATE:  02/10/2024, CONSULTATION DATE:  02/10/2024 REFERRING MD:  Dr. Lester, CHIEF COMPLAINT:  Pcomm aneurysm    History of Present Illness:  [name] is a 68 year old female with history of HTN, cholelithiasis, CBD dilatation s/p EUS (11/25/2023), T2DM, glaucoma who was recently admitted for syncope and incidentally found to have right posterior communicating artery aneurysm. She is now s/p elective coiling of posterior communicating aneurysm with Dr. Lester on 02/10/2024.   Post-IR she was admitted to the ICU for close neuro and hemodynamic monitoring. PCCM was consulted in this context.   Pertinent  Medical History  HTN Cholelithiasis  CBD dilatation s/p EUS (11/25/2023) T2DM Glaucoma   Significant Hospital Events: Including procedures, antibiotic start and stop dates in addition to other pertinent events   02/10/2024: IR s/p coiling of right Pcomm aneurysm  12/4 postop day 1, patient stable, no issues overnight  Interim History / Subjective:  No major issues overnight   No complaints of pain,  Difficulty with sleeping but per patient this is baseline   Objective    Blood pressure 119/66, pulse 84, temperature 98.1 F (36.7 C), temperature source Axillary, resp. rate 16, height 5' 3 (1.6 m), weight 51.3 kg, SpO2 97%.        Intake/Output Summary (Last 24 hours) at 02/11/2024 0842 Last data filed at 02/11/2024 0000 Gross per 24 hour  Intake 1303 ml  Output 920 ml  Net 383 ml   Filed Weights   02/08/24 1118 02/10/24 0827  Weight: 51.3 kg 51.3 kg    Examination: General: acute on chronic older adult female, lying in icu bed, NAD HEENT: Normocephalic, PERRLA intact, Pink MM, missing teeth CV: s1,s2, RRR, no MRG, No JVD  pulm: clear, diminished, no distress on RA  Abs: bs active, soft  Extremities: no edema, no deformity, moves all extremities on command  Skin: no rash  Neuro: alert,  oriented x 4,  GU: purewick   Resolved problem list   Assessment and Plan   Pcomm aneursym s/p coiling 12/3  P: Continue post-op neurosurg management Not needing cleviprex, can dc order, SBP goal < 160 Continue neuro monitoring Mobilize patient out of bed  Transfer out of ICU or discharge- will defer to neurosurg   Headaches  No complaints of headaches  P:  Continue PRN tylenol , Fioricet   HTN hx  P: Continue benazepril   Continue to hold hydrochlorothiazide   GERD  P: Continue home PPI   DM2 P: Continue SSI  Continue CBG goal 140-180    Labs   CBC: Recent Labs  Lab 02/10/24 0905  WBC 4.2  HGB 11.8*  HCT 37.5  MCV 103.9*  PLT 218    Basic Metabolic Panel: Recent Labs  Lab 02/10/24 0905  NA 142  K 4.5  CL 108  CO2 24  GLUCOSE 97  BUN 17  CREATININE 0.96  CALCIUM 9.2   GFR: Estimated Creatinine Clearance: 44.2 mL/min (by C-G formula based on SCr of 0.96 mg/dL). Recent Labs  Lab 02/10/24 0905  WBC 4.2    Liver Function Tests: No results for input(s): AST, ALT, ALKPHOS, BILITOT, PROT, ALBUMIN in the last 168 hours. No results for input(s): LIPASE, AMYLASE in the last 168 hours. No results for input(s): AMMONIA in the last 168 hours.  ABG No results found for: PHART, PCO2ART, PO2ART, HCO3, TCO2, ACIDBASEDEF, O2SAT   Coagulation Profile: No results for input(s): INR, PROTIME  in the last 168 hours.  Cardiac Enzymes: No results for input(s): CKTOTAL, CKMB, CKMBINDEX, TROPONINI in the last 168 hours.  HbA1C: Hgb A1c MFr Bld  Date/Time Value Ref Range Status  12/03/2023 05:38 AM 5.6 4.8 - 5.6 % Final    Comment:    (NOTE) Diagnosis of Diabetes The following HbA1c ranges recommended by the American Diabetes Association (ADA) may be used as an aid in the diagnosis of diabetes mellitus.  Hemoglobin             Suggested A1C NGSP%              Diagnosis  <5.7                   Non  Diabetic  5.7-6.4                Pre-Diabetic  >6.4                   Diabetic  <7.0                   Glycemic control for                       adults with diabetes.    01/28/2022 09:45 AM 5.9 (H) 4.8 - 5.6 % Final    Comment:    (NOTE) Pre diabetes:          5.7%-6.4%  Diabetes:              >6.4%  Glycemic control for   <7.0% adults with diabetes     CBG: Recent Labs  Lab 02/10/24 1026 02/10/24 1238 02/10/24 1530 02/10/24 1950 02/10/24 2342  GLUCAP 90 91 98 192* 167*    Total time: 40 mins    Christian Catlynn Grondahl AGACNP-BC   [facility] 02/11/2024, 8:51 AM  Please see [email or web address] for pager details.  From 7A-7P if no response, please call [phone]. After hours, please call ELink [phone].

## 2024-02-11 NOTE — Discharge Instructions (Addendum)
 Continue home medications  Tylenol  for mild pain Start taking aspirin  81mg  daily for 1 month. Take constipation medications so that you are not straining, some combination of over-the-counter miralax , senna, prune juice, or whatever works for you at home Mobilize as able, this is very important for overall recovery If you notice any new neurological symptoms -- such as weakness, numbness, trouble speaking, vision changes, or loss of balance -- call the clinic right away. Follow up in clinic in 1 month

## 2024-03-04 ENCOUNTER — Other Ambulatory Visit: Payer: Self-pay

## 2024-03-07 NOTE — Telephone Encounter (Signed)
 Automatic refill refused at this time.

## 2024-03-07 NOTE — Telephone Encounter (Signed)
 Called patient to verify if she sent this request or if it was the pharmacy auto refilling the medication. Awaiting call back from patient.

## 2024-03-16 ENCOUNTER — Encounter: Payer: Self-pay | Admitting: Neuroradiology

## 2024-03-16 ENCOUNTER — Ambulatory Visit: Admitting: Neuroradiology

## 2024-03-16 VITALS — BP 125/73 | HR 76 | Temp 97.9°F | Ht 63.25 in | Wt 112.6 lb

## 2024-03-16 DIAGNOSIS — Z09 Encounter for follow-up examination after completed treatment for conditions other than malignant neoplasm: Secondary | ICD-10-CM | POA: Diagnosis not present

## 2024-03-16 DIAGNOSIS — I671 Cerebral aneurysm, nonruptured: Secondary | ICD-10-CM | POA: Diagnosis not present

## 2024-03-16 DIAGNOSIS — Z95828 Presence of other vascular implants and grafts: Secondary | ICD-10-CM | POA: Diagnosis not present

## 2024-03-16 NOTE — Progress Notes (Signed)
 I had the pleasure of seeing [name] in the office today for 1 month follow-up after coiling of a posterior communicating artery aneurysm on 02/10/2024.  The procedure was done with radial access.  She has made an uneventful recovery.  Neurologic exam is intact.  Radial puncture site is healed.  There is a good radial pulse.  We talked about the need for follow-up.  Plan is for a single-vessel arteriogram 6 months from now.  She can stop taking aspirin .

## 2024-03-29 NOTE — Addendum Note (Signed)
 Addended by: Ottis Vacha on: 03/29/2024 10:48 AM   Modules accepted: Orders
